# Patient Record
Sex: Female | Born: 1970
Health system: Southern US, Community
[De-identification: ages and names within clinical notes are randomized; demographics above are authoritative.]

## PROBLEM LIST (undated history)

## (undated) DIAGNOSIS — E119 Type 2 diabetes mellitus without complications: Secondary | ICD-10-CM

## (undated) DIAGNOSIS — IMO0002 Reserved for concepts with insufficient information to code with codable children: Secondary | ICD-10-CM

## (undated) DIAGNOSIS — R51 Headache: Secondary | ICD-10-CM

## (undated) DIAGNOSIS — N390 Urinary tract infection, site not specified: Secondary | ICD-10-CM

## (undated) DIAGNOSIS — R87619 Unspecified abnormal cytological findings in specimens from cervix uteri: Secondary | ICD-10-CM

## (undated) DIAGNOSIS — F419 Anxiety disorder, unspecified: Secondary | ICD-10-CM

## (undated) HISTORY — PX: NO PAST SURGERIES: SHX2092

---

## 2001-12-29 ENCOUNTER — Other Ambulatory Visit: Admission: RE | Admit: 2001-12-29 | Discharge: 2001-12-29 | Payer: Self-pay | Admitting: Obstetrics and Gynecology

## 2002-02-05 ENCOUNTER — Encounter: Payer: Self-pay | Admitting: *Deleted

## 2002-02-05 ENCOUNTER — Emergency Department (HOSPITAL_COMMUNITY): Admission: EM | Admit: 2002-02-05 | Discharge: 2002-02-06 | Payer: Self-pay | Admitting: Emergency Medicine

## 2003-02-27 ENCOUNTER — Other Ambulatory Visit: Admission: RE | Admit: 2003-02-27 | Discharge: 2003-02-27 | Payer: Self-pay | Admitting: Obstetrics and Gynecology

## 2004-02-28 ENCOUNTER — Other Ambulatory Visit: Admission: RE | Admit: 2004-02-28 | Discharge: 2004-02-28 | Payer: Self-pay | Admitting: Obstetrics and Gynecology

## 2005-08-26 ENCOUNTER — Other Ambulatory Visit: Admission: RE | Admit: 2005-08-26 | Discharge: 2005-08-26 | Payer: Self-pay | Admitting: Obstetrics and Gynecology

## 2005-09-15 ENCOUNTER — Encounter: Admission: RE | Admit: 2005-09-15 | Discharge: 2005-09-15 | Payer: Self-pay | Admitting: Obstetrics and Gynecology

## 2008-09-18 ENCOUNTER — Other Ambulatory Visit: Admission: RE | Admit: 2008-09-18 | Discharge: 2008-09-18 | Payer: Self-pay | Admitting: Obstetrics & Gynecology

## 2010-03-06 ENCOUNTER — Encounter: Admission: RE | Admit: 2010-03-06 | Discharge: 2010-03-06 | Payer: Self-pay | Admitting: Obstetrics and Gynecology

## 2010-05-02 LAB — HIV ANTIBODY (ROUTINE TESTING W REFLEX): HIV: NONREACTIVE

## 2010-05-02 LAB — ABO/RH

## 2010-05-02 LAB — HEPATITIS B SURFACE ANTIGEN: Hepatitis B Surface Ag: NEGATIVE

## 2010-05-02 LAB — CBC: Hemoglobin: 12.9 g/dL (ref 12.0–16.0)

## 2010-05-02 LAB — TYPE AND SCREEN: Antibody Screen: NEGATIVE

## 2010-05-02 LAB — RPR: RPR: NONREACTIVE

## 2010-05-26 NOTE — L&D Delivery Note (Signed)
Delivery Note At 10:57 AM a viable female was delivered via Vaginal, Spontaneous Delivery (Presentation: Left Occiput Anterior).  APGAR9/9:   Placenta status: , .  Cord: 3 vessels with the following complications: None.  Placenta intact with 3 vessel cord  Anesthesia: Epidural  Episiotomy: None Lacerations: 2nd degree Suture Repair: 2.0 chromic Est. Blood Loss (mL): 400  Mom to postpartum.  Baby to nursery-stable.  Lanitra Battaglini S 12/19/2010, 11:13 AM

## 2010-09-11 ENCOUNTER — Observation Stay (HOSPITAL_COMMUNITY)
Admission: AD | Admit: 2010-09-11 | Discharge: 2010-09-12 | Disposition: A | Discharge status: Home / Self Care | Payer: Managed Care, Other (non HMO) | Source: Ambulatory Visit | Attending: Obstetrics and Gynecology | Admitting: Obstetrics and Gynecology

## 2010-09-11 ENCOUNTER — Inpatient Hospital Stay (HOSPITAL_COMMUNITY): Payer: Managed Care, Other (non HMO)

## 2010-09-11 DIAGNOSIS — R109 Unspecified abdominal pain: Secondary | ICD-10-CM | POA: Insufficient documentation

## 2010-09-11 DIAGNOSIS — O99891 Other specified diseases and conditions complicating pregnancy: Principal | ICD-10-CM | POA: Insufficient documentation

## 2010-09-11 LAB — URINALYSIS, ROUTINE W REFLEX MICROSCOPIC
Bilirubin Urine: NEGATIVE
Ketones, ur: NEGATIVE mg/dL
Nitrite: NEGATIVE
Urobilinogen, UA: 0.2 mg/dL (ref 0.0–1.0)

## 2010-09-11 LAB — COMPREHENSIVE METABOLIC PANEL
ALT: 10 U/L (ref 0–35)
AST: 16 U/L (ref 0–37)
Albumin: 3 g/dL — ABNORMAL LOW (ref 3.5–5.2)
CO2: 20 mEq/L (ref 19–32)
Chloride: 108 mEq/L (ref 96–112)
GFR calc Af Amer: >60 mL/min (ref >60)
GFR calc non Af Amer: >60 mL/min (ref >60)
Potassium: 3.5 mEq/L (ref 3.5–5.1)
Sodium: 136 mEq/L (ref 135–145)
Total Bilirubin: 0.3 mg/dL (ref 0.3–1.2)

## 2010-09-11 LAB — URINE MICROSCOPIC-ADD ON

## 2010-09-11 LAB — CBC
Hemoglobin: 10.6 g/dL — ABNORMAL LOW (ref 12.0–15.0)
RBC: 3.51 MIL/uL — ABNORMAL LOW (ref 3.87–5.11)
WBC: 14.6 10*3/uL — ABNORMAL HIGH (ref 4.0–10.5)

## 2010-09-26 NOTE — Discharge Summary (Signed)
  Joanne Little, Joanne Little                  ACCOUNT NO.:  [id number]  MEDICAL RECORD NO.:  [id number]           PATIENT TYPE:  O  LOCATION:  9305                          FACILITY:  [facility]  PHYSICIAN:  Hatem Cull L. Savahna Casados, M.D.DATE OF BIRTH:  [birth year]  DATE OF ADMISSION:  09/11/2010 DATE OF DISCHARGE:  09/12/2010                              DISCHARGE SUMMARY   ADMITTING DIAGNOSES: 1. Intrauterine pregnancy at 93 weeks estimated gestational age. 2. Left flank pain.  DISCHARGE DIAGNOSES: 1. Intrauterine pregnancy at 91 weeks estimated gestational age. 2. Left flank pain resolved.  REASON FOR ADMISSION:  Please see written H&P.  HOSPITAL COURSE:  The patient is a 40 year old white married female gravida 2, para 0 that was admitted to [facility] at 69 weeks' estimated gestational age with sudden onset of left flank pain. The patient has also had some associated nausea and vomiting.  The patient was now admitted for renal ultrasound and pain management.  The patient was started on the morphine pump and did undergo ultrasound which revealed no hydronephrosis bilaterally.  Right ureteral jet was seen.  Left ureteral jet not demonstrated.  The patient was placed on n.p.o. status and placed on bedrest.  Urinalysis had revealed negative for nitrites, negative for leukocytosis, positive for hematuria.  Fetal fibronectin was also performed which was negative.  The following morning, pain was significantly improved.  Baby was active.  She denied any further nausea, vomiting, and felt hungry.  She denied any loss of fluid or vaginal bleeding.  Vital signs were stable with blood pressure 121/72.  Abdomen soft.  CBC had revealed hemoglobin of 10.6 and platelet count of 239,000.  Liver function tests were within normal limits.  The patient's diet was advanced and she continued to strain the urine on the following morning.  The patient denied any further pain.  Vital signs remained  stable.  Uterus was nontender.  Discharge instructions were reviewed and the patient was later discharged home.  CONDITION ON DISCHARGE:  Stable.  DIET:  Regular as tolerated.  ACTIVITY:  Up as tolerated.  She is to call for increasing pain, dysuria, decreasing fetal movement, loss of amniotic fluid, or vaginal bleeding.  DISCHARGE MEDICATIONS:  Prenatal vitamins 1 p.o. daily.     Julio Sicks, N.P.   ______________________________ Stann Mainland. Vincente Poli, M.D.    CC/MEDQ  D:  09/12/2010  T:  09/12/2010  Job:  [id number]  Electronically Signed by Julio Sicks N.P. on 09/16/2010 08:39:43 AM Electronically Signed by Marcelle Overlie M.D. on 09/26/2010 08:43:24 AM

## 2010-11-12 LAB — STREP B DNA PROBE
GBS: NEGATIVE
GBS: NEGATIVE

## 2010-12-16 ENCOUNTER — Encounter (HOSPITAL_COMMUNITY): Payer: Self-pay | Admitting: *Deleted

## 2010-12-16 ENCOUNTER — Inpatient Hospital Stay (HOSPITAL_COMMUNITY)
Admission: AD | Admit: 2010-12-16 | Discharge: 2010-12-16 | Disposition: A | Discharge status: Home / Self Care | Payer: Managed Care, Other (non HMO) | Source: Ambulatory Visit | Attending: Obstetrics and Gynecology | Admitting: Obstetrics and Gynecology

## 2010-12-16 DIAGNOSIS — O479 False labor, unspecified: Secondary | ICD-10-CM | POA: Insufficient documentation

## 2010-12-16 HISTORY — DX: Urinary tract infection, site not specified: N39.0

## 2010-12-16 HISTORY — DX: Anxiety disorder, unspecified: F41.9

## 2010-12-16 HISTORY — DX: Reserved for concepts with insufficient information to code with codable children: IMO0002

## 2010-12-16 HISTORY — DX: Unspecified abnormal cytological findings in specimens from cervix uteri: R87.619

## 2010-12-16 NOTE — Progress Notes (Signed)
Contractions now every 8 min, started at 1300. No bleeding or leaking.

## 2010-12-18 ENCOUNTER — Inpatient Hospital Stay (HOSPITAL_COMMUNITY)
Admission: AD | Admit: 2010-12-18 | Discharge: 2010-12-21 | DRG: 774 | Disposition: A | Discharge status: Home / Self Care | Payer: Managed Care, Other (non HMO) | Source: Ambulatory Visit | Attending: Obstetrics and Gynecology | Admitting: Obstetrics and Gynecology

## 2010-12-18 ENCOUNTER — Encounter (HOSPITAL_COMMUNITY): Payer: Self-pay | Admitting: *Deleted

## 2010-12-18 DIAGNOSIS — D649 Anemia, unspecified: Secondary | ICD-10-CM

## 2010-12-18 DIAGNOSIS — Z3490 Encounter for supervision of normal pregnancy, unspecified, unspecified trimester: Secondary | ICD-10-CM

## 2010-12-18 DIAGNOSIS — O480 Post-term pregnancy: Principal | ICD-10-CM | POA: Diagnosis present

## 2010-12-18 DIAGNOSIS — K649 Unspecified hemorrhoids: Secondary | ICD-10-CM | POA: Diagnosis present

## 2010-12-18 DIAGNOSIS — O878 Other venous complications in the puerperium: Secondary | ICD-10-CM | POA: Diagnosis present

## 2010-12-18 DIAGNOSIS — O09529 Supervision of elderly multigravida, unspecified trimester: Secondary | ICD-10-CM | POA: Diagnosis present

## 2010-12-18 HISTORY — DX: Headache: R51

## 2010-12-18 LAB — CBC
HCT: 33.1 % — ABNORMAL LOW (ref 36.0–46.0)
Hemoglobin: 11.1 g/dL — ABNORMAL LOW (ref 12.0–15.0)
MCH: 30.2 pg (ref 26.0–34.0)
MCHC: 33.5 g/dL (ref 30.0–36.0)

## 2010-12-18 MED ORDER — MISOPROSTOL 25 MCG QUARTER TABLET
25.0000 ug | ORAL_TABLET | ORAL | Status: AC
  Administered 2010-12-18 – 2010-12-19 (×2): 25 ug via VAGINAL
  Filled 2010-12-18 (×2): qty 0.25

## 2010-12-18 MED ORDER — OXYCODONE-ACETAMINOPHEN 5-325 MG PO TABS
2.0000 | ORAL_TABLET | ORAL | Status: DC | PRN
  Administered 2010-12-19: 2 via ORAL
  Filled 2010-12-18: qty 2

## 2010-12-18 MED ORDER — ACETAMINOPHEN 325 MG PO TABS: 650.0000 mg | ORAL_TABLET | ORAL | Status: DC | PRN

## 2010-12-18 MED ORDER — FLEET ENEMA 7-19 GM/118ML RE ENEM: 1.0000 | ENEMA | RECTAL | Status: DC | PRN

## 2010-12-18 MED ORDER — ONDANSETRON HCL 4 MG/2ML IJ SOLN: 4.0000 mg | Freq: Four times a day (QID) | INTRAMUSCULAR | Status: DC | PRN

## 2010-12-18 MED ORDER — CITRIC ACID-SODIUM CITRATE 334-500 MG/5ML PO SOLN: 30.0000 mL | ORAL | Status: DC | PRN

## 2010-12-18 MED ORDER — OXYTOCIN 20 UNITS IN LACTATED RINGERS INFUSION - SIMPLE
125.0000 mL/h | Freq: Once | INTRAVENOUS | Status: DC
  Administered 2010-12-19: 125 mL/h via INTRAVENOUS
  Filled 2010-12-18: qty 1000

## 2010-12-18 MED ORDER — LACTATED RINGERS IV SOLN
INTRAVENOUS | Status: DC
  Administered 2010-12-18 – 2010-12-19 (×2): via INTRAVENOUS
  Administered 2010-12-19: 125 mL/h via INTRAVENOUS

## 2010-12-18 MED ORDER — MISOPROSTOL 25 MCG QUARTER TABLET: 25.0000 ug | ORAL_TABLET | ORAL | Status: DC

## 2010-12-18 MED ORDER — IBUPROFEN 600 MG PO TABS
600.0000 mg | ORAL_TABLET | Freq: Four times a day (QID) | ORAL | Status: DC | PRN
  Administered 2010-12-19: 600 mg via ORAL
  Filled 2010-12-18: qty 1

## 2010-12-18 MED ORDER — LACTATED RINGERS IV SOLN: 500.0000 mL | INTRAVENOUS | Status: DC | PRN

## 2010-12-18 MED ORDER — ZOLPIDEM TARTRATE 10 MG PO TABS
10.0000 mg | ORAL_TABLET | Freq: Every evening | ORAL | Status: DC | PRN
  Administered 2010-12-18: 10 mg via ORAL
  Filled 2010-12-18: qty 1

## 2010-12-18 MED ORDER — LIDOCAINE HCL (PF) 1 % IJ SOLN
30.0000 mL | INTRAMUSCULAR | Status: DC | PRN
  Filled 2010-12-18 (×2): qty 30

## 2010-12-18 NOTE — Plan of Care (Signed)
Pt and husb are anxious to be moved to [facility] for AROM and Pitocin as planned when she was called to the [facility] by Dr. Arelia Sneddon today.  She has been in MAU  X 8.45 hours. Dr. Arelia Sneddon called due to continue wait for BS room availability.   The plan is to give pt a choice, she may eat regular diet if she wants to have cytotec tonight and Pitocin in the AM and to be clear liquids today if she wants to start Pitocin when BS room available. This discussed with patient and she elects  to continue to walk off and on while in MAU and agrees to the IV start for hydration

## 2010-12-18 NOTE — Progress Notes (Signed)
Pt comfortable in lounge chair, IV started, lights down, call light within reach, labs done

## 2010-12-18 NOTE — ED Notes (Signed)
Pt. tearful, states she is just emotional. States she has not slept in several nights. Pt. Reassurred.

## 2010-12-18 NOTE — Progress Notes (Signed)
More comfortable walking, wants to rest at this time, placed on left side, EFM applied

## 2010-12-18 NOTE — Progress Notes (Signed)
Up to walk.

## 2010-12-18 NOTE — Progress Notes (Signed)
Dr. Arelia Sneddon at bedside and notified of pt status, SVE, FHR, UC pattern, and pt's pain. Will continue to monitor.

## 2010-12-18 NOTE — Progress Notes (Signed)
  Patient finally got to labor and delivery at 6 pm.  Desired regular diet and cytotec instead of arom.  Cervix unchanged.  Fhr reactive no decel with irreg ctx's.

## 2010-12-18 NOTE — Progress Notes (Signed)
Pt states she is having irreg contractions. The office called and told to come today.

## 2010-12-18 NOTE — H&P (Signed)
Joanne Little is a 40 y.o. female presenting for spontaneous onset of labor and induction for postdates at 65 6/7 weeks.   Prenatal course complicated by ama with negative first trimester screen decline amnio.  Neg GBS.  History of tobacco use History OB History    Grav Para Term Preterm Abortions TAB SAB Ect Mult Living   2 0 0 0 1 0 1 0 0 0      Past Medical History  Diagnosis Date  . Kidney stones   . Urinary tract infection   . Anxiety   . Abnormal Pap smear   . Kidney stones    Past Surgical History  Procedure Date  . No past surgeries    Family History: family history is not on file. Social History:  reports that she has been smoking.  She has never used smokeless tobacco. She reports that she does not drink alcohol. Her drug history not on file.  ROS  Dilation: 3.5 Effacement (%): 80 Station: -2 Exam by:: Dr. Arelia Sneddon Blood pressure 123/65, pulse 92, temperature 98.4 F (36.9 C), temperature source Oral, resp. rate 20, height 5\' 9"  (1.753 m), weight 113.127 kg (249 lb 6.4 oz), last menstrual period 03/07/2010, SpO2 98.00%. Exam Physical Exam Gravid uterus c/w dates efw 7.5 lbs.  Cervix 3 +  80% effaced vtx at -1. Adequate pelvimetry.  fhr reactive no decels Prenatal labs: ABO, Rh:   Antibody: Negative (12/08 0000) Rubella:   RPR: Nonreactive (12/08 0000)  HBsAg: Negative (12/08 0000)  HIV: Non-reactive (12/08 0000)  GBS: Negative, Negative, Negative (06/19 0000)   Assessment/Plan:admitt for induction at post dates.  Procede with AROM and pitocin.   Christophe Rising S 12/18/2010, 8:33 AM

## 2010-12-18 NOTE — ED Notes (Signed)
EFM D/C'd for walking. Continues to take PO fluids

## 2010-12-19 ENCOUNTER — Inpatient Hospital Stay (HOSPITAL_COMMUNITY): Payer: Managed Care, Other (non HMO) | Admitting: Anesthesiology

## 2010-12-19 ENCOUNTER — Encounter (HOSPITAL_COMMUNITY): Payer: Self-pay | Admitting: *Deleted

## 2010-12-19 ENCOUNTER — Encounter (HOSPITAL_COMMUNITY): Payer: Self-pay | Admitting: Anesthesiology

## 2010-12-19 DIAGNOSIS — Z3490 Encounter for supervision of normal pregnancy, unspecified, unspecified trimester: Secondary | ICD-10-CM

## 2010-12-19 MED ORDER — LANOLIN HYDROUS EX OINT: TOPICAL_OINTMENT | CUTANEOUS | Status: DC | PRN

## 2010-12-19 MED ORDER — OXYTOCIN 20 UNITS IN LACTATED RINGERS INFUSION - SIMPLE
INTRAVENOUS | Status: AC
  Filled 2010-12-19: qty 1000

## 2010-12-19 MED ORDER — BISACODYL 10 MG RE SUPP: 10.0000 mg | Freq: Every day | RECTAL | Status: DC | PRN

## 2010-12-19 MED ORDER — SIMETHICONE 80 MG PO CHEW: 80.0000 mg | CHEWABLE_TABLET | ORAL | Status: DC | PRN

## 2010-12-19 MED ORDER — PHENYLEPHRINE 40 MCG/ML (10ML) SYRINGE FOR IV PUSH (FOR BLOOD PRESSURE SUPPORT)
80.0000 ug | PREFILLED_SYRINGE | INTRAVENOUS | Status: DC | PRN
  Filled 2010-12-19: qty 5

## 2010-12-19 MED ORDER — PRENATAL PLUS 27-1 MG PO TABS: 1.0000 | ORAL_TABLET | Freq: Every day | ORAL | Status: DC

## 2010-12-19 MED ORDER — HYDROCORTISONE ACE-PRAMOXINE 1-1 % RE FOAM
1.0000 | Freq: Two times a day (BID) | RECTAL | Status: DC
  Administered 2010-12-20 – 2010-12-21 (×2): 1 via RECTAL
  Filled 2010-12-19: qty 10

## 2010-12-19 MED ORDER — SODIUM CHLORIDE 0.9 % IJ SOLN
3.0000 mL | Freq: Two times a day (BID) | INTRAMUSCULAR | Status: DC
  Administered 2010-12-20 (×2): 3 mL via INTRAVENOUS

## 2010-12-19 MED ORDER — TETANUS-DIPHTH-ACELL PERTUSSIS 5-2.5-18.5 LF-MCG/0.5 IM SUSP: 0.5000 mL | Freq: Once | INTRAMUSCULAR | Status: DC

## 2010-12-19 MED ORDER — MEASLES, MUMPS & RUBELLA VAC ~~LOC~~ INJ: 0.5000 mL | INJECTION | Freq: Once | SUBCUTANEOUS | Status: DC

## 2010-12-19 MED ORDER — BENZOCAINE-MENTHOL 20-0.5 % EX AERO
INHALATION_SPRAY | CUTANEOUS | Status: AC
  Filled 2010-12-19: qty 56

## 2010-12-19 MED ORDER — FLEET ENEMA 7-19 GM/118ML RE ENEM: 1.0000 | ENEMA | RECTAL | Status: DC | PRN

## 2010-12-19 MED ORDER — ONDANSETRON HCL 4 MG PO TABS: 4.0000 mg | ORAL_TABLET | ORAL | Status: DC | PRN

## 2010-12-19 MED ORDER — DIPHENHYDRAMINE HCL 25 MG PO CAPS: 25.0000 mg | ORAL_CAPSULE | Freq: Four times a day (QID) | ORAL | Status: DC | PRN

## 2010-12-19 MED ORDER — WITCH HAZEL-GLYCERIN EX PADS: MEDICATED_PAD | CUTANEOUS | Status: DC | PRN

## 2010-12-19 MED ORDER — DIPHENHYDRAMINE HCL 50 MG/ML IJ SOLN: 12.5000 mg | INTRAMUSCULAR | Status: DC | PRN

## 2010-12-19 MED ORDER — NALBUPHINE SYRINGE 5 MG/0.5 ML
10.0000 mg | INJECTION | Freq: Four times a day (QID) | INTRAMUSCULAR | Status: DC | PRN
  Administered 2010-12-19: 10 mg via INTRAVENOUS
  Filled 2010-12-19 (×2): qty 1

## 2010-12-19 MED ORDER — EPHEDRINE 5 MG/ML INJ
10.0000 mg | INTRAVENOUS | Status: DC | PRN
  Filled 2010-12-19: qty 4

## 2010-12-19 MED ORDER — METHYLERGONOVINE MALEATE 0.2 MG PO TABS
0.2000 mg | ORAL_TABLET | Freq: Three times a day (TID) | ORAL | Status: DC
  Administered 2010-12-19 – 2010-12-21 (×5): 0.2 mg via ORAL
  Filled 2010-12-19 (×6): qty 1

## 2010-12-19 MED ORDER — SENNOSIDES-DOCUSATE SODIUM 8.6-50 MG PO TABS
1.0000 | ORAL_TABLET | Freq: Every day | ORAL | Status: DC
  Administered 2010-12-20: 1 via ORAL

## 2010-12-19 MED ORDER — BENZOCAINE-MENTHOL 20-0.5 % EX AERO: 1.0000 "application " | INHALATION_SPRAY | Freq: Four times a day (QID) | CUTANEOUS | Status: DC | PRN

## 2010-12-19 MED ORDER — SODIUM CHLORIDE 0.9 % IJ SOLN: 3.0000 mL | INTRAMUSCULAR | Status: DC | PRN

## 2010-12-19 MED ORDER — SODIUM CHLORIDE 0.9 % IJ SOLN: 3.0000 mL | Freq: Two times a day (BID) | INTRAMUSCULAR | Status: DC

## 2010-12-19 MED ORDER — ONDANSETRON HCL 4 MG/2ML IJ SOLN: 4.0000 mg | INTRAMUSCULAR | Status: DC | PRN

## 2010-12-19 MED ORDER — SENNOSIDES-DOCUSATE SODIUM 8.6-50 MG PO TABS: 1.0000 | ORAL_TABLET | Freq: Every day | ORAL | Status: DC

## 2010-12-19 MED ORDER — SODIUM CHLORIDE 0.9 % IV SOLN: 250.0000 mL | INTRAVENOUS | Status: DC

## 2010-12-19 MED ORDER — FENTANYL 2.5 MCG/ML BUPIVACAINE 1/10 % EPIDURAL INFUSION (WH - ANES)
14.0000 mL/h | INTRAMUSCULAR | Status: DC
  Administered 2010-12-19 (×2): 14 mL/h via EPIDURAL
  Filled 2010-12-19 (×2): qty 60

## 2010-12-19 MED ORDER — IBUPROFEN 600 MG PO TABS
600.0000 mg | ORAL_TABLET | Freq: Four times a day (QID) | ORAL | Status: DC
  Administered 2010-12-19 – 2010-12-21 (×8): 600 mg via ORAL
  Filled 2010-12-19 (×8): qty 1

## 2010-12-19 MED ORDER — OXYTOCIN 20 UNITS IN LACTATED RINGERS INFUSION - SIMPLE
125.0000 mL/h | INTRAVENOUS | Status: DC | PRN
  Administered 2010-12-19: 125 mL/h via INTRAVENOUS

## 2010-12-19 MED ORDER — OXYCODONE-ACETAMINOPHEN 5-325 MG PO TABS: 1.0000 | ORAL_TABLET | ORAL | Status: DC | PRN

## 2010-12-19 MED ORDER — DIBUCAINE 1 % RE OINT
TOPICAL_OINTMENT | RECTAL | Status: DC | PRN
  Administered 2010-12-20: 11:00:00 via RECTAL
  Filled 2010-12-19: qty 28
  Filled 2010-12-19: qty 56

## 2010-12-19 MED ORDER — PHENYLEPHRINE 40 MCG/ML (10ML) SYRINGE FOR IV PUSH (FOR BLOOD PRESSURE SUPPORT)
80.0000 ug | PREFILLED_SYRINGE | INTRAVENOUS | Status: DC | PRN
  Filled 2010-12-19 (×2): qty 5

## 2010-12-19 MED ORDER — NALBUPHINE HCL 10 MG/ML IJ SOLN
10.0000 mg | Freq: Four times a day (QID) | INTRAMUSCULAR | Status: DC | PRN
  Filled 2010-12-19: qty 1

## 2010-12-19 MED ORDER — BENZOCAINE-MENTHOL 20-0.5 % EX AERO: 1.0000 "application " | INHALATION_SPRAY | CUTANEOUS | Status: DC | PRN

## 2010-12-19 MED ORDER — OXYCODONE-ACETAMINOPHEN 5-325 MG PO TABS
1.0000 | ORAL_TABLET | ORAL | Status: DC | PRN
  Administered 2010-12-19: 2 via ORAL
  Administered 2010-12-20: 1 via ORAL
  Administered 2010-12-20 (×2): 2 via ORAL
  Administered 2010-12-21: 1 via ORAL
  Filled 2010-12-19: qty 2
  Filled 2010-12-19: qty 1
  Filled 2010-12-19 (×6): qty 2

## 2010-12-19 MED ORDER — MEDROXYPROGESTERONE ACETATE 150 MG/ML IM SUSP: 150.0000 mg | INTRAMUSCULAR | Status: DC | PRN

## 2010-12-19 MED ORDER — EPHEDRINE 5 MG/ML INJ
10.0000 mg | INTRAVENOUS | Status: DC | PRN
  Filled 2010-12-19 (×2): qty 4

## 2010-12-19 MED ORDER — METHYLERGONOVINE MALEATE 0.2 MG/ML IJ SOLN
0.2000 mg | Freq: Once | INTRAMUSCULAR | Status: AC
  Administered 2010-12-19: 0.2 mg via INTRAMUSCULAR
  Filled 2010-12-19: qty 1

## 2010-12-19 MED ORDER — ZOLPIDEM TARTRATE 5 MG PO TABS: 5.0000 mg | ORAL_TABLET | Freq: Every evening | ORAL | Status: DC | PRN

## 2010-12-19 MED ORDER — NALBUPHINE HCL 10 MG/ML IJ SOLN: 10.0000 mg | Freq: Four times a day (QID) | INTRAMUSCULAR | Status: DC | PRN

## 2010-12-19 MED ORDER — LACTATED RINGERS IV SOLN
500.0000 mL | Freq: Once | INTRAVENOUS | Status: AC
  Administered 2010-12-19: 500 mL via INTRAVENOUS

## 2010-12-19 MED ORDER — OXYTOCIN 20 UNITS IN LACTATED RINGERS INFUSION - SIMPLE: 125.0000 mL/h | INTRAVENOUS | Status: DC | PRN

## 2010-12-19 MED ORDER — NALBUPHINE SYRINGE 5 MG/0.5 ML
10.0000 mg | INJECTION | Freq: Once | INTRAMUSCULAR | Status: AC
  Administered 2010-12-19: 10 mg via INTRAVENOUS

## 2010-12-19 MED ORDER — IBUPROFEN 600 MG PO TABS: 600.0000 mg | ORAL_TABLET | Freq: Four times a day (QID) | ORAL | Status: DC

## 2010-12-19 MED ORDER — PRENATAL PLUS 27-1 MG PO TABS
1.0000 | ORAL_TABLET | Freq: Every day | ORAL | Status: DC
  Administered 2010-12-20 – 2010-12-21 (×2): 1 via ORAL
  Filled 2010-12-19 (×2): qty 1

## 2010-12-19 NOTE — Progress Notes (Signed)
Pt up to Br at 1445 and started bleeding and passing clots, pt back to bed and continued to bleed and pass clots IV of LR with 20units pitocin started at 125/hr. Dr. Vincente Poli called and came immediately to the room and removed clots.  Methergine IM given and Nubain 10mg  given

## 2010-12-19 NOTE — Anesthesia Postprocedure Evaluation (Signed)
Vital signs stable Patient alert Pain and nausea are controlled No apparent anesthetic complications No follow up care needed Pt may be d/c when legs are bending 

## 2010-12-19 NOTE — Anesthesia Preprocedure Evaluation (Addendum)
Anesthesia Evaluation  Name, MR# and DOB Patient awake  General Assessment Comment  Reviewed: Allergy & Precautions, H&P  and Patient's Chart, lab work & pertinent test results  Airway Mallampati: III TM Distance: >3 FB Neck ROM: full    Dental  (+) Teeth Intact   Pulmonary  clear to auscultation    Cardiovascular regular Normal   Neuro/Psych  GI/Hepatic/Renal   Endo/Other   (+)  Morbid obesity Abdominal   Musculoskeletal  Hematology   Peds  Reproductive/Obstetrics (+) Pregnancy   Anesthesia Other Findings                 Anesthesia Physical Anesthesia Plan  ASA: III  Anesthesia Plan: Epidural   Post-op Pain Management:    Induction:   Airway Management Planned:   Additional Equipment:   Intra-op Plan:   Post-operative Plan:   Informed Consent: I have reviewed the patients History and Physical, chart, labs and discussed the procedure including the risks, benefits and alternatives for the proposed anesthesia with the patient or authorized representative who has indicated his/her understanding and acceptance.   Dental Advisory Given  Plan Discussed with: CRNA and Surgeon  Anesthesia Plan Comments: (Labs checked- platelets confirmed with RN in room. Fetal heart tracing, per RN, reportedly stable enough for sitting procedure. Discussed epidural, and patient consents to the procedure:  included risk of possible headache,backache, failed block, allergic reaction, and nerve injury. This patient was asked if she had any questions or concerns before the procedure started. )        Anesthesia Quick Evaluation

## 2010-12-19 NOTE — Progress Notes (Signed)
KYLEENA SCHEIRER is a 40 y.o. G2P0010 at [redacted]w[redacted]d by LMP admitted for induction of labor due to Post dates.   Subjective  Status post epidural, comfortable   Objective: BP 120/70  Pulse 87  Temp(Src) 97.8 F (36.6 C) (Oral)  Resp 18  Ht 5\' 9"  (1.753 m)  Wt 113.127 kg (249 lb 6.4 oz)  BMI 36.83 kg/m2  SpO2 98%  LMP 03/07/2010      FHT:  FHR: 150 bpm, variability: moderate,  accelerations:  Present,  decelerations:  Absent UC:   regular, every 2 minutes SVE:   Dilation: 9 Effacement (%): 100 Station: -1 Exam by:: Dr. Vincente Poli  Labs: Lab Results  Component Value Date   WBC 12.6* 12/18/2010   HGB 11.1* 12/18/2010   HCT 33.1* 12/18/2010   MCV 90.2 12/18/2010   PLT 241 12/18/2010    Assessment / Plan: Induction of labor due to postterm,  progressing well on pitocin  Labor: Progressing normally Preeclampsia:  no signs or symptoms of toxicity Fetal Wellbeing:  Category I Pain Control:  Epidural I/D:  n/a Anticipated MOD:  NSVD  Shawnette Augello L 12/19/2010, 7:52 AM

## 2010-12-19 NOTE — Progress Notes (Signed)
Dr. Vincente Poli notified of pt status, SVE, FHR, UC pattern, and pt pushing with each UC.  Will continue to monitor.

## 2010-12-19 NOTE — Progress Notes (Signed)
Dr. Vincente Poli at bedside and notified of pt status, SVE, FHR, UC pattern, and epidural placement.  AROM, will continue to monitor.

## 2010-12-19 NOTE — Plan of Care (Signed)
Problem: Discharge Progression Outcomes Goal: Barriers To Progression Addressed/Resolved Outcome: Progressing ppHemm- required manual removal x2, Methergine , & extra Pitocin

## 2010-12-19 NOTE — Anesthesia Procedure Notes (Addendum)
Epidural Patient location during procedure: OB Start time: 12/19/2010 6:24 AM  Staffing Anesthesiologist: Jiles Garter  Preanesthetic Checklist Completed: patient identified, site marked, surgical consent, pre-op evaluation, timeout performed, IV checked, risks and benefits discussed and monitors and equipment checked  Epidural Patient position: sitting Prep: site prepped and draped and DuraPrep Patient monitoring: continuous pulse ox and blood pressure Approach: midline Injection technique: LOR air  Needle:  Needle type: Tuohy  Needle gauge: 17 G Needle length: 9 cm Needle insertion depth: 7 cm Catheter type: closed end flexible Catheter size: 19 Gauge Catheter at skin depth: 14 cm Test dose: negative  Assessment Events: blood not aspirated, injection not painful, no injection resistance, negative IV test and no paresthesia  Additional Notes Dosing of Epidural: 1st dose, Through needle...... 5mg  Marcaine 2nd dose, through catheter.... epi 1:200K + Xylocaine 40 mg 3rd dose, through catheter...Marland KitchenMarland Kitchenepi 1:200K + Xylocaine 60 mg Each dose occurred after waiting 3 min,patient was free of IV sx; and patient exhibits no evidence of SA injection  Patient is more comfortable after epidural dosed. Please see RN's note for documentation of vital signs,and FHR which are stable.

## 2010-12-19 NOTE — Progress Notes (Signed)
  CTSP for passage of large amount of clots in bathroom I immediately arrived and patient already on the bed with 2 nurses there. She was very uncomfortable Bleeding was diminished but uterine fundus 2 fb above umbilicus Vaginal exam - removed moderate amount of clots from vagina. Uterus then 1 fb below umbilicus Will give nubain x 1 And methergine x 1  Then put on po methergine series. Questions answered at bedside.

## 2010-12-19 NOTE — Progress Notes (Signed)
Dr. Vincente Poli at bedside and notified of pt status, FHR, UC pattern, and pt pushing with each UC. Will continue to monitor.

## 2010-12-19 NOTE — Progress Notes (Signed)
Dr. Arelia Sneddon at bedside for delivery and notified of pt status, SVE, FHR, UC pattern, and pt pushing with each UC. Will continue to monitor.

## 2010-12-19 NOTE — Progress Notes (Signed)
ctsp by nurse patient complaining of significant rectal pressure. Bleeding earlier has become very light since i cleared out clots and administered methergine. Exam with nurse 2 large swollen nonthrombosed hemorrhoids Internal exam  No vaginal hematoma but i did evacuate about 200 cc old clots from the vagina Patient tolerated it poorly - if she needs another exam i recommend with iv sedation Uterus firm 2 fb below umbilicus Will give iv nubain now Continue methergine series Ice pack to perineum Patient reports history of "always heavy periods" and with this excessive bleeding postpartum I discussed with her possible hematology consult postpartum to rule out possible Von [place]ands' All questions answered at bedside. Spent approx 25 min with the patient.

## 2010-12-20 LAB — CBC
HCT: 21.9 % — ABNORMAL LOW (ref 36.0–46.0)
Hemoglobin: 7.3 g/dL — ABNORMAL LOW (ref 12.0–15.0)
MCH: 30.3 pg (ref 26.0–34.0)
MCHC: 33.3 g/dL (ref 30.0–36.0)
MCV: 90.9 fL (ref 78.0–100.0)
RDW: 14.7 % (ref 11.5–15.5)

## 2010-12-20 MED ORDER — FERROUS SULFATE 325 (65 FE) MG PO TABS
325.0000 mg | ORAL_TABLET | Freq: Three times a day (TID) | ORAL | Status: DC
  Administered 2010-12-20 – 2010-12-21 (×3): 325 mg via ORAL
  Filled 2010-12-20 (×3): qty 1

## 2010-12-20 NOTE — Progress Notes (Signed)
[facility] INFORMATION GIVEN TO PATIENT.  THIS IS FIRST BABY AND PATIENT STATES BABY HAS BEEN FEEDING WELL SINCE BIRTH.  SEVERAL VOIDS AND STOOLS NOTED.  ENCOURAGED TO CALL LC WITH CONCERNS OR ASSIST.

## 2010-12-20 NOTE — Progress Notes (Signed)
Post Partum Day 1 Subjective: c/o tired  Objective: Blood pressure 87/59, pulse 92, temperature 97.9 F (36.6 C), temperature source Oral, resp. rate 18, height 5\' 9"  (1.753 m), weight 113.127 kg (249 lb 6.4 oz), last menstrual period 03/07/2010, SpO2 98.00%, unknown if currently breastfeeding.  Physical Exam:  General: alert and cooperative Lochia: appropriate Uterine Fundus: firm Perineum intact, small hemorrhoid,soft DVT Evaluation: No evidence of DVT seen on physical exam.   Basename 12/20/10 0535 12/18/10 1541  HGB 7.3* 11.1*  HCT 21.9* 33.1*    Assessment/Plan: Plan for discharge tomorrow FeSo4  CBC in am   LOS: 2 days   Joanne Little G 12/20/2010, 8:26 AM

## 2010-12-21 LAB — CBC
MCH: 30.5 pg (ref 26.0–34.0)
MCHC: 33.2 g/dL (ref 30.0–36.0)
MCV: 91.8 fL (ref 78.0–100.0)
Platelets: 186 10*3/uL (ref 150–400)
RBC: 2.2 MIL/uL — ABNORMAL LOW (ref 3.87–5.11)
RDW: 15 % (ref 11.5–15.5)

## 2010-12-21 MED ORDER — METHYLERGONOVINE MALEATE 0.2 MG PO TABS
0.2000 mg | ORAL_TABLET | Freq: Three times a day (TID) | ORAL | Status: AC
Start: 2010-12-21 — End: 2011-12-21

## 2010-12-21 MED ORDER — FERROUS SULFATE 325 (65 FE) MG PO TABS: 325.0000 mg | ORAL_TABLET | Freq: Three times a day (TID) | ORAL | Status: DC

## 2010-12-21 MED ORDER — BENZOCAINE-MENTHOL 20-0.5 % EX AERO
INHALATION_SPRAY | CUTANEOUS | Status: AC
  Filled 2010-12-21: qty 56

## 2010-12-21 MED ORDER — IBUPROFEN 600 MG PO TABS: 600.0000 mg | ORAL_TABLET | Freq: Four times a day (QID) | ORAL | Status: AC

## 2010-12-21 MED ORDER — OXYCODONE-ACETAMINOPHEN 5-325 MG PO TABS: 1.0000 | ORAL_TABLET | ORAL | Status: AC | PRN

## 2010-12-21 NOTE — Progress Notes (Signed)
Post Partum Day 2 Subjective: no complaints, no further heavy vb  Objective: Blood pressure 107/71, pulse 96, temperature 98.3 F (36.8 C), temperature source Oral, resp. rate 18, height 5\' 9"  (1.753 m), weight 113.127 kg (249 lb 6.4 oz), last menstrual period 03/07/2010, SpO2 98.00%, unknown if currently breastfeeding.  Physical Exam:  General: alert and cooperative Lochia: appropriate Uterine Fundus: firm DVT Evaluation: No evidence of DVT seen on physical exam.   Basename 12/21/10 0500 12/20/10 0535  HGB 6.7* 7.3*  HCT 20.2* 21.9*    Assessment/Plan: Discharge home Rx percocet, methergine, iron.  Resume pnv   LOS: 3 days   Joanne Little 12/21/2010, 8:33 AM

## 2010-12-21 NOTE — Progress Notes (Signed)
Poor position observed, pinching in football hold. Positional strip noted on left nipple assistance in proper position and better support.. Mother receptive to teaching. inst to hand pump for a few mins to reduce swelling of tissue. Scheduled out patient visit for August 6 at 1:00.

## 2010-12-21 NOTE — Discharge Summary (Signed)
Obstetric Discharge Summary Reason for Admission: induction of labor Prenatal Procedures: none Intrapartum Procedures: spontaneous vaginal delivery Postpartum Procedures: postpartum hemmorage with evacuation of clots Complications-Operative and Postpartum: hemorrhage  Hemoglobin  Date Value Range Status  12/21/2010 6.7* 12.0-15.0 (g/dL) Final     CRITICAL RESULT CALLED TO, READ BACK BY AND VERIFIED WITH:     BURNS,S. AT 1610 ON 12/21/2010 BY HOUEGNIFIO M.     DELTA CHECK NOTED     REPEATED TO VERIFY     HCT  Date Value Range Status  12/21/2010 20.2* 36.0-46.0 (%) Final    Discharge Diagnoses: Term Pregnancy-delivered and postpartum hemorrhage  Discharge Information: Date: 12/21/2010 Activity: unrestricted Diet: routine Medications: PNV, Ibuprophen, Iron, Percocet and methergine Condition: stable Instructions: refer to practice specific booklet Discharge to: home Follow up office:  Tuesday for Hgb and BP check   Newborn Data: Live born  Information for the patient's newborn:  Shayleen, Eppinger [[id number]]  female ; APGAR , ; weight ;  Home with mother.  Ellicia Alix 12/21/2010, 8:38 AM

## 2010-12-30 ENCOUNTER — Ambulatory Visit (HOSPITAL_COMMUNITY)
Admit: 2010-12-30 | Discharge: 2010-12-30 | Disposition: A | Discharge status: Home / Self Care | Payer: Managed Care, Other (non HMO) | Attending: Obstetrics and Gynecology | Admitting: Obstetrics and Gynecology

## 2010-12-30 NOTE — Progress Notes (Signed)
Mom is here for outpatient appointment for feeding assessment. [name] was born [birth year], now 71 days old, birth weight was 6lb 11 oz.. Weight at discharge from the hospital was 6lb 4oz. Mom reports baby nursing every 2-3 hours for 15 minutes usually both breasts. She has been pumping to relieve some slight engorgement and baby has been supplemented once each night with a bottle. Mom reports baby voiding/stooling 5-6 times/day.  With visit today, baby latched well to right breast, nursed for 15 minutes and transferred per weight 50ml. Pre-feed weight was 6lb 12.7 oz/3082 gm. Post-feed weight after nursing on right breast was 6lb. 14.5 oz/3132 gm. Baby nursed on left breast for 11 minutes and per weight transferred 32 ml. Pre-feed weight was 6lb 14.5oz and post-feed with was 6lb. 15.6oz/3164gm. Good rhythmic suck and swallows heard with breastfeeding. Breasts softening after nursing. No redness/warmth or signs of infection observed.  Total received with feeding was 82 ml.  Discussed with mom engorgement care, postpumping to comfort to relieve nodules, if present, using ice packs/heat for comfort. Signs and symptoms of infection reviewed. Pumping and storage reviewed. Parents reassured regarding breastfeeding.

## 2011-01-03 ENCOUNTER — Encounter (HOSPITAL_COMMUNITY): Payer: Self-pay | Admitting: *Deleted

## 2013-06-08 ENCOUNTER — Encounter: Payer: Self-pay | Admitting: Family Medicine

## 2013-06-08 ENCOUNTER — Ambulatory Visit (INDEPENDENT_AMBULATORY_CARE_PROVIDER_SITE_OTHER): Payer: 59 | Admitting: Family Medicine

## 2013-06-08 VITALS — BP 128/84 | Ht 70.5 in | Wt 256.0 lb

## 2013-06-08 DIAGNOSIS — J069 Acute upper respiratory infection, unspecified: Secondary | ICD-10-CM

## 2013-06-08 DIAGNOSIS — H6690 Otitis media, unspecified, unspecified ear: Secondary | ICD-10-CM

## 2013-06-08 DIAGNOSIS — H6691 Otitis media, unspecified, right ear: Secondary | ICD-10-CM

## 2013-06-08 MED ORDER — HYDROCODONE-ACETAMINOPHEN 7.5-325 MG PO TABS: 1.0000 | ORAL_TABLET | Freq: Four times a day (QID) | ORAL | Status: DC | PRN

## 2013-06-08 MED ORDER — FLUTICASONE PROPIONATE 50 MCG/ACT NA SUSP: 2.0000 | Freq: Every day | NASAL | Status: DC

## 2013-06-08 MED ORDER — CEFPROZIL 500 MG PO TABS: 500.0000 mg | ORAL_TABLET | Freq: Two times a day (BID) | ORAL | Status: DC

## 2013-06-08 NOTE — Patient Instructions (Signed)
Otitis Media, Adult Otitis media is redness, soreness, and swelling (inflammation) of the middle ear. Otitis media may be caused by allergies or, most commonly, by infection. Often it occurs as a complication of the common cold. SIGNS AND SYMPTOMS Symptoms of otitis media may include:  Earache.  Fever.  Ringing in your ear.  Headache.  Leakage of fluid from the ear. DIAGNOSIS To diagnose otitis media, your health care provider will examine your ear with an otoscope. This is an instrument that allows your health care provider to see into your ear in order to examine your eardrum. Your health care provider also will ask you questions about your symptoms. TREATMENT  Typically, otitis media resolves on its own within 3 5 days. Your health care provider may prescribe medicine to ease your symptoms of pain. If otitis media does not resolve within 5 days or is recurrent, your health care provider may prescribe antibiotic medicines if he or she suspects that a bacterial infection is the cause. HOME CARE INSTRUCTIONS   Take your medicine as directed until it is gone, even if you feel better after the first few days.  Only take over-the-counter or prescription medicines for pain, discomfort, or fever as directed by your health care provider.  Follow up with your health care provider as directed. SEEK MEDICAL CARE IF:  You have otitis media only in one ear or bleeding from your nose or both.  You notice a lump on your neck.  You are not getting better in 3 5 days.  You feel worse instead of better. SEEK IMMEDIATE MEDICAL CARE IF:   You have pain that is not controlled with medicine.  You have swelling, redness, or pain around your ear or stiffness in your neck.  You notice that part of your face is paralyzed.  You notice that the bone behind your ear (mastoid) is tender when you touch it. MAKE SURE YOU:   Understand these instructions.  Will watch your condition.  Will get help  right away if you are not doing well or get worse. Document Released: 02/15/2004 Document Revised: 03/02/2013 Document Reviewed: 12/07/2012 Exit[employer] Patient Information 2014 [employer].  

## 2013-06-08 NOTE — Progress Notes (Signed)
   Subjective:    Patient ID: Joanne Little, female    DOB: 12/20/1970, 43 y.o.   MRN: [id number]  Otalgia  There is pain in both ears. The current episode started in the past 7 days. Associated symptoms include headaches and a sore throat. Treatments tried: amoxil 875 BID, otc decogestant, vit c. The treatment provided no relief.   Patient relates some sinus symptoms recently but now having severe ear pain kept her up last night   Review of Systems  HENT: Positive for ear pain and sore throat.   Neurological: Positive for headaches.       Objective:   Physical Exam Right otitis media is noted left TM is normal nostrils normal throat normal neck supple mild sinus tenderness right side lungs clear heart regular patient not toxic       Assessment & Plan:  Upper respiratory illness along with a right otitis media antibiotics prescribed followup if progressive troubles warning signs discussed no sign of the influenza.  Pain medicine was prescribed for the patient cautioned drowsiness

## 2013-06-20 ENCOUNTER — Telehealth: Payer: Self-pay | Admitting: Family Medicine

## 2013-06-20 MED ORDER — AMOXICILLIN-POT CLAVULANATE 875-125 MG PO TABS: 1.0000 | ORAL_TABLET | Freq: Two times a day (BID) | ORAL | Status: DC

## 2013-06-20 NOTE — Telephone Encounter (Signed)
Augmentin 875 mg one tablet twice a day with food for the next 10 days if ongoing trouble followup

## 2013-06-20 NOTE — Telephone Encounter (Signed)
Patient was seen 1/14 with earpain and given antibotic still having earpain can you call in something else or does she need to make appointment. [facility] [place]

## 2013-06-20 NOTE — Telephone Encounter (Signed)
Medication sent to pharmacy. Patient was notified.  

## 2013-06-20 NOTE — Addendum Note (Signed)
Addended by: Dereck LigasJOHNSON, Shamon Lobo P on: 06/20/2013 02:13 PM   Modules accepted: Orders

## 2013-06-28 ENCOUNTER — Other Ambulatory Visit: Payer: Self-pay

## 2013-06-28 DIAGNOSIS — Z1231 Encounter for screening mammogram for malignant neoplasm of breast: Secondary | ICD-10-CM

## 2013-07-08 ENCOUNTER — Ambulatory Visit
Admission: RE | Admit: 2013-07-08 | Discharge: 2013-07-08 | Disposition: A | Discharge status: Home / Self Care | Payer: 59 | Source: Ambulatory Visit

## 2013-07-08 ENCOUNTER — Ambulatory Visit
Admission: RE | Admit: 2013-07-08 | Discharge: 2013-07-08 | Disposition: A | Discharge status: Home / Self Care | Payer: 59 | Source: Ambulatory Visit | Attending: Obstetrics and Gynecology | Admitting: Obstetrics and Gynecology

## 2013-07-08 ENCOUNTER — Other Ambulatory Visit: Payer: Self-pay | Admitting: Obstetrics and Gynecology

## 2013-07-08 DIAGNOSIS — N6452 Nipple discharge: Secondary | ICD-10-CM

## 2013-07-08 DIAGNOSIS — Z1231 Encounter for screening mammogram for malignant neoplasm of breast: Secondary | ICD-10-CM

## 2013-10-31 ENCOUNTER — Encounter: Payer: Self-pay | Admitting: Family Medicine

## 2013-10-31 ENCOUNTER — Ambulatory Visit (INDEPENDENT_AMBULATORY_CARE_PROVIDER_SITE_OTHER): Payer: 59 | Admitting: Family Medicine

## 2013-10-31 VITALS — BP 112/90 | Temp 98.3°F | Ht 70.5 in | Wt 251.0 lb

## 2013-10-31 DIAGNOSIS — J209 Acute bronchitis, unspecified: Secondary | ICD-10-CM

## 2013-10-31 DIAGNOSIS — J45909 Unspecified asthma, uncomplicated: Secondary | ICD-10-CM

## 2013-10-31 DIAGNOSIS — Z0189 Encounter for other specified special examinations: Secondary | ICD-10-CM

## 2013-10-31 DIAGNOSIS — J069 Acute upper respiratory infection, unspecified: Secondary | ICD-10-CM

## 2013-10-31 MED ORDER — CEFPROZIL 500 MG PO TABS: 500.0000 mg | ORAL_TABLET | Freq: Two times a day (BID) | ORAL | Status: DC

## 2013-10-31 MED ORDER — PREDNISONE 20 MG PO TABS: ORAL_TABLET | ORAL | Status: AC

## 2013-10-31 MED ORDER — ALBUTEROL SULFATE HFA 108 (90 BASE) MCG/ACT IN AERS: 2.0000 | INHALATION_SPRAY | Freq: Four times a day (QID) | RESPIRATORY_TRACT | Status: DC | PRN

## 2013-10-31 NOTE — Progress Notes (Signed)
   Subjective:    Patient ID: Joanne Little, female    DOB: [birth year], 43 y.o.   MRN: [id number]  Cough This is a new problem. The current episode started 1 to 4 weeks ago. The problem has been gradually worsening. The problem occurs constantly. The cough is productive of sputum. Associated symptoms include chills, a fever and rhinorrhea. Pertinent negatives include no chest pain, ear pain, shortness of breath or wheezing. Nothing aggravates the symptoms. Treatments tried: Mucinex. The treatment provided no relief.   Patient states she has no other concerns at this time.  Started 10 days ago  Denies any vomiting but does relate some fever and chills. Daughter recently sick with asthma flareup and infection Review of Systems  Constitutional: Positive for fever and chills. Negative for activity change.  HENT: Positive for congestion and rhinorrhea. Negative for ear pain.   Eyes: Negative for discharge.  Respiratory: Positive for cough. Negative for shortness of breath and wheezing.   Cardiovascular: Negative for chest pain.  Gastrointestinal: Negative for nausea, vomiting and abdominal pain.       Objective:   Physical Exam  Nursing note and vitals reviewed. Constitutional: She appears well-developed.  HENT:  Head: Normocephalic.  Nose: Nose normal.  Mouth/Throat: Oropharynx is clear and moist. No oropharyngeal exudate.  Neck: Neck supple.  Cardiovascular: Normal rate and normal heart sounds.   No murmur heard. Pulmonary/Chest: Effort normal. She has wheezes.  Lymphadenopathy:    She has no cervical adenopathy.  Skin: Skin is warm and dry.          Assessment & Plan:  Upper respiratory illness along with reactive airway disease and bronchitis-prednisone taper, antibiotics, albuterol. Patient strongly urged to quit smoking she is going to try nicotine patches. Although we could do pulmonary function testing I don't feel necessary at this point.

## 2014-03-27 ENCOUNTER — Encounter: Payer: Self-pay | Admitting: Family Medicine

## 2016-03-04 DIAGNOSIS — Z6837 Body mass index (BMI) 37.0-37.9, adult: Secondary | ICD-10-CM | POA: Diagnosis not present

## 2016-03-04 DIAGNOSIS — Z01419 Encounter for gynecological examination (general) (routine) without abnormal findings: Secondary | ICD-10-CM | POA: Diagnosis not present

## 2016-05-01 ENCOUNTER — Other Ambulatory Visit: Payer: Self-pay | Admitting: Family Medicine

## 2016-05-01 ENCOUNTER — Telehealth: Payer: Self-pay | Admitting: Family Medicine

## 2016-05-01 MED ORDER — CEFPROZIL 500 MG PO TABS: 500.0000 mg | ORAL_TABLET | Freq: Two times a day (BID) | ORAL | 0 refills | Status: DC

## 2016-05-01 NOTE — Telephone Encounter (Signed)
The husband relates that his wife's had head congestion drainage coughing sinus pressure not feeling good over the past several days unable to calm to be seen because of her work he requested an antibiotic we went ahead and prescribed an antibiotic Cefzil with warnings that if she starts having chest congestion coughing shortness of breath or need to be seen

## 2016-05-09 ENCOUNTER — Encounter: Payer: Self-pay | Admitting: Nurse Practitioner

## 2016-05-09 ENCOUNTER — Ambulatory Visit (INDEPENDENT_AMBULATORY_CARE_PROVIDER_SITE_OTHER): Payer: BLUE CROSS/BLUE SHIELD | Admitting: Nurse Practitioner

## 2016-05-09 VITALS — BP 114/80 | Temp 98.1°F | Ht 70.5 in | Wt 263.0 lb

## 2016-05-09 DIAGNOSIS — J310 Chronic rhinitis: Secondary | ICD-10-CM

## 2016-05-09 DIAGNOSIS — J329 Chronic sinusitis, unspecified: Secondary | ICD-10-CM | POA: Diagnosis not present

## 2016-05-09 MED ORDER — HYDROCODONE-HOMATROPINE 5-1.5 MG/5ML PO SYRP: 5.0000 mL | ORAL_SOLUTION | ORAL | 0 refills | Status: DC | PRN

## 2016-05-09 MED ORDER — LEVOFLOXACIN 500 MG PO TABS: 500.0000 mg | ORAL_TABLET | Freq: Every day | ORAL | 0 refills | Status: DC

## 2016-05-09 MED ORDER — METHYLPREDNISOLONE ACETATE 40 MG/ML IJ SUSP
40.0000 mg | Freq: Once | INTRAMUSCULAR | Status: AC
  Administered 2016-05-09: 40 mg via INTRAMUSCULAR

## 2016-05-10 ENCOUNTER — Encounter: Payer: Self-pay | Admitting: Nurse Practitioner

## 2016-05-10 NOTE — Progress Notes (Signed)
Subjective:  Presents for c/o sinus symptoms x 2 weeks. Had a fever of 103.4 first but has resolved. Sore throat. Headache. Frequent cough producing green/brown mucus. Rare wheezing. Ear pain. Has been on Cefzil for the past week with minimal improvement. Smokes one pack per day.  Objective:   BP 114/80   Temp 98.1 F (36.7 C) (Oral)   Ht 5' 10.5" (1.791 m)   Wt 263 lb (119.3 kg)   BMI 37.20 kg/m  NAD. Alert, oriented. TMs retracted bilat; moderate erythema on the left. Posterior pharynx erythema with PND noted. Neck supple with mild anterior adenopathy. Lungs clear. Heart RRR.   Assessment:   Plan:  Meds ordered this encounter  Medications  . levofloxacin (LEVAQUIN) 500 MG tablet    Sig: Take 1 tablet (500 mg total) by mouth daily.    Dispense:  10 tablet    Refill:  0    Order Specific Question:   Supervising Provider    Answer:   Merlyn AlbertLUKING, WILLIAM S [[id number]]  . HYDROcodone-homatropine (HYCODAN) 5-1.5 MG/5ML syrup    Sig: Take 5 mLs by mouth every 4 (four) hours as needed.    Dispense:  90 mL    Refill:  0    Order Specific Question:   Supervising Provider    Answer:   Merlyn AlbertLUKING, WILLIAM S [[id number]]  . methylPREDNISolone acetate (DEPO-MEDROL) injection 40 mg    Stop Cefzil.  OTC meds as directed. Call back next week if no improvement, sooner if worse.

## 2016-05-15 ENCOUNTER — Ambulatory Visit: Payer: Self-pay

## 2016-06-19 ENCOUNTER — Encounter: Payer: Self-pay | Admitting: Family Medicine

## 2016-06-19 ENCOUNTER — Ambulatory Visit (INDEPENDENT_AMBULATORY_CARE_PROVIDER_SITE_OTHER): Payer: BLUE CROSS/BLUE SHIELD | Admitting: Family Medicine

## 2016-06-19 VITALS — BP 122/86 | Temp 99.2°F | Ht 70.5 in | Wt 256.0 lb

## 2016-06-19 DIAGNOSIS — J329 Chronic sinusitis, unspecified: Secondary | ICD-10-CM

## 2016-06-19 DIAGNOSIS — J310 Chronic rhinitis: Secondary | ICD-10-CM

## 2016-06-19 MED ORDER — LEVOFLOXACIN 500 MG PO TABS: 500.0000 mg | ORAL_TABLET | Freq: Every day | ORAL | 0 refills | Status: AC

## 2016-06-19 NOTE — Progress Notes (Signed)
   Subjective:    Patient ID: Joanne Little, female    DOB: 12/01/1970, 46 y.o.   MRN: [id number]  Sinusitis  This is a new problem. Episode onset: 4 days. Associated symptoms include congestion, coughing, headaches and sinus pressure. (Nausea, diarrhea, fever, body aches ) Treatments tried: advil, tylenol.   mon aft rticke in the throat   tmax 102  Feeling bad since No flus shot this yr  Pos smoke iexpos   Using tylenol advil sinus    Cough ongoing   Ears feel fine  enery lelve l zero , app comes and goes  nt bad some off anon     Review of Systems  HENT: Positive for congestion and sinus pressure.   Respiratory: Positive for cough.   Neurological: Positive for headaches.       Objective:   Physical Exam Alert, mild malaise. Hydration good Vitals stable. frontal/ maxillary tenderness evident positive nasal congestion. pharynx normal neck supple  lungs clear/no crackles or wheezes. heart regular in rhythm        Assessment & Plan:  Impression rhinosinusitisLikely post flu with element of bronchitis also likely post viral, discussed with patient. plan antibiotics prescribed. Questions answered. Symptomatic care discussed. warning signs discussed. WSL

## 2017-02-23 ENCOUNTER — Ambulatory Visit (INDEPENDENT_AMBULATORY_CARE_PROVIDER_SITE_OTHER): Payer: BLUE CROSS/BLUE SHIELD | Admitting: Family Medicine

## 2017-02-23 ENCOUNTER — Encounter: Payer: Self-pay | Admitting: Family Medicine

## 2017-02-23 DIAGNOSIS — J069 Acute upper respiratory infection, unspecified: Secondary | ICD-10-CM

## 2017-02-23 DIAGNOSIS — J019 Acute sinusitis, unspecified: Secondary | ICD-10-CM | POA: Diagnosis not present

## 2017-02-23 MED ORDER — AMOXICILLIN-POT CLAVULANATE 875-125 MG PO TABS: 1.0000 | ORAL_TABLET | Freq: Two times a day (BID) | ORAL | 0 refills | Status: DC

## 2017-02-23 MED ORDER — BUPROPION HCL ER (SR) 150 MG PO TB12: 150.0000 mg | ORAL_TABLET | Freq: Two times a day (BID) | ORAL | 2 refills | Status: DC

## 2017-02-23 NOTE — Progress Notes (Signed)
   Subjective:    Patient ID: Joanne Little, female    DOB: 04-19-1971, 46 y.o.   MRN: [id number]  Cough  This is a new problem. The current episode started in the past 7 days. Associated symptoms include a fever, headaches, nasal congestion, rhinorrhea and a sore throat. Pertinent negatives include no chest pain, ear pain, shortness of breath or wheezing. Treatments tried: otc cold med.   Viral like illness for multiple days now with chest congestion coughing sinus pressure pain no wheezing or discomfort no shortness of breath did have some fevers patient is a smoker she like to be older quit smoking but does not one use Chantix because of history of postpartum depression currently on Celexa   Review of Systems  Constitutional: Positive for fever. Negative for activity change.  HENT: Positive for congestion, rhinorrhea and sore throat. Negative for ear pain.   Eyes: Negative for discharge.  Respiratory: Positive for cough. Negative for shortness of breath and wheezing.   Cardiovascular: Negative for chest pain.  Neurological: Positive for headaches.       Objective:   Physical Exam  Constitutional: She appears well-developed.  HENT:  Head: Normocephalic.  Right Ear: External ear normal.  Left Ear: External ear normal.  Nose: Nose normal.  Mouth/Throat: Oropharynx is clear and moist. No oropharyngeal exudate.  Eyes: Right eye exhibits no discharge. Left eye exhibits no discharge.  Neck: Neck supple. No tracheal deviation present.  Cardiovascular: Normal rate and normal heart sounds.   No murmur heard. Pulmonary/Chest: Effort normal and breath sounds normal. She has no wheezes. She has no rales.  Lymphadenopathy:    She has no cervical adenopathy.  Skin: Skin is warm and dry.  Nursing note and vitals reviewed.    Moderate sinus tenderness lungs are clear no respiratory distress     Assessment & Plan:  Viral syndrome Secondary rhinosinusitis Antibiotics prescribed warning  signs discussed follow-up if progressive troubles or worse patient encouraged quit smoking She would like to use Wellbutrin twice daily over the course of the next few months

## 2017-02-23 NOTE — Patient Instructions (Signed)
Coping with Quitting Smoking Quitting smoking is a physical and mental challenge. You will face cravings, withdrawal symptoms, and temptation. Before quitting, work with your health care provider to make a plan that can help you cope. Preparation can help you quit and keep you from giving in. How can I cope with cravings? Cravings usually last for 5-10 minutes. If you get through it, the craving will pass. Consider taking the following actions to help you cope with cravings:  Keep your mouth busy: ? Chew sugar-free gum. ? Suck on hard candies or a straw. ? Brush your teeth.  Keep your hands and body busy: ? Immediately change to a different activity when you feel a craving. ? Squeeze or play with a ball. ? Do an activity or a hobby, like making bead jewelry, practicing needlepoint, or working with wood. ? Mix up your normal routine. ? Take a short exercise break. Go for a quick walk or run up and down stairs. ? Spend time in public places where smoking is not allowed.  Focus on doing something kind or helpful for someone else.  Call a friend or family member to talk during a craving.  Join a support group.  Call a quit line, such as 1-800-QUIT-NOW.  Talk with your health care provider about medicines that might help you cope with cravings and make quitting easier for you.  How can I deal with withdrawal symptoms? Your body may experience negative effects as it tries to get used to not having nicotine in the system. These effects are called withdrawal symptoms. They may include:  Feeling hungrier than normal.  Trouble concentrating.  Irritability.  Trouble sleeping.  Feeling depressed.  Restlessness and agitation.  Craving a cigarette.  To manage withdrawal symptoms:  Avoid places, people, and activities that trigger your cravings.  Remember why you want to quit.  Get plenty of sleep.  Avoid coffee and other caffeinated drinks. These may worsen some of your  symptoms.  How can I handle social situations? Social situations can be difficult when you are quitting smoking, especially in the first few weeks. To manage this, you can:  Avoid parties, bars, and other social situations where people might be smoking.  Avoid alcohol.  Leave right away if you have the urge to smoke.  Explain to your family and friends that you are quitting smoking. Ask for understanding and support.  Plan activities with friends or family where smoking is not an option.  What are some ways I can cope with stress? Wanting to smoke may cause stress, and stress can make you want to smoke. Find ways to manage your stress. Relaxation techniques can help. For example:  Breathe slowly and deeply, in through your nose and out through your mouth.  Listen to soothing, relaxing music.  Talk with a family member or friend about your stress.  Light a candle.  Soak in a bath or take a shower.  Think about a peaceful place.  What are some ways I can prevent weight gain? Be aware that many people gain weight after they quit smoking. However, not everyone does. To keep from gaining weight, have a plan in place before you quit and stick to the plan after you quit. Your plan should include:  Having healthy snacks. When you have a craving, it may help to: ? Eat plain popcorn, crunchy carrots, celery, or other cut vegetables. ? Chew sugar-free gum.  Changing how you eat: ? Eat small portion sizes at meals. ?   Eat 4-6 small meals throughout the day instead of 1-2 large meals a day. ? Be mindful when you eat. Do not watch television or do other things that might distract you as you eat.  Exercising regularly: ? Make time to exercise each day. If you do not have time for a long workout, do short bouts of exercise for 5-10 minutes several times a day. ? Do some form of strengthening exercise, like weight lifting, and some form of aerobic exercise, like running or  swimming.  Drinking plenty of water or other low-calorie or no-calorie drinks. Drink 6-8 glasses of water daily, or as much as instructed by your health care provider.  Summary  Quitting smoking is a physical and mental challenge. You will face cravings, withdrawal symptoms, and temptation to smoke again. Preparation can help you as you go through these challenges.  You can cope with cravings by keeping your mouth busy (such as by chewing gum), keeping your body and hands busy, and making calls to family, friends, or a helpline for people who want to quit smoking.  You can cope with withdrawal symptoms by avoiding places where people smoke, avoiding drinks with caffeine, and getting plenty of rest.  Ask your health care provider about the different ways to prevent weight gain, avoid stress, and handle social situations. This information is not intended to replace advice given to you by your health care provider. Make sure you discuss any questions you have with your health care provider. Document Released: 05/09/2016 Document Revised: 05/09/2016 Document Reviewed: 05/09/2016 Elsevier Interactive Patient Education  2018 [employer]  

## 2017-03-19 ENCOUNTER — Other Ambulatory Visit: Payer: Self-pay | Admitting: *Deleted

## 2017-03-19 MED ORDER — BUPROPION HCL ER (SR) 150 MG PO TB12: 150.0000 mg | ORAL_TABLET | Freq: Two times a day (BID) | ORAL | 2 refills | Status: DC

## 2017-03-26 ENCOUNTER — Other Ambulatory Visit: Payer: Self-pay | Admitting: *Deleted

## 2017-03-26 MED ORDER — BUPROPION HCL ER (SR) 150 MG PO TB12: 150.0000 mg | ORAL_TABLET | Freq: Two times a day (BID) | ORAL | 0 refills | Status: DC

## 2017-06-18 ENCOUNTER — Other Ambulatory Visit: Payer: Self-pay | Admitting: *Deleted

## 2017-06-18 NOTE — Telephone Encounter (Signed)
30 day and 1 rf needs ov within 60 days

## 2017-06-19 MED ORDER — BUPROPION HCL ER (SR) 150 MG PO TB12: 150.0000 mg | ORAL_TABLET | Freq: Two times a day (BID) | ORAL | 1 refills | Status: DC

## 2017-07-10 ENCOUNTER — Telehealth: Payer: Self-pay | Admitting: Family Medicine

## 2017-07-10 MED ORDER — OSELTAMIVIR PHOSPHATE 75 MG PO CAPS: 75.0000 mg | ORAL_CAPSULE | Freq: Two times a day (BID) | ORAL | 0 refills | Status: DC

## 2017-07-10 NOTE — Telephone Encounter (Signed)
Husband and daughter saw Dr Brett CanalesSteve yesterday pm and were diagnosed with the flu. Now patient is having symptoms and would like tamiflu called in

## 2017-07-10 NOTE — Telephone Encounter (Signed)
Husband Joanne Little calling because he was seen yesterday and so was [name]. Both diagnosed with the flu.  Joanne Little came home sick today. Fever, cough, H/A, achey.  He wants to know if we can call in Tamiflu for [facility] to the [facility] [place].

## 2017-07-10 NOTE — Telephone Encounter (Signed)
Prescription sent electronically to pharmacy. Patient notified. 

## 2017-07-10 NOTE — Telephone Encounter (Signed)
Please send in Tamiflu 75 mg 1 twice daily for 5 days-certainly if progressive symptoms or if worse follow-up

## 2017-07-14 DIAGNOSIS — Z1231 Encounter for screening mammogram for malignant neoplasm of breast: Secondary | ICD-10-CM | POA: Diagnosis not present

## 2017-07-14 DIAGNOSIS — Z01419 Encounter for gynecological examination (general) (routine) without abnormal findings: Secondary | ICD-10-CM | POA: Diagnosis not present

## 2017-07-14 DIAGNOSIS — Z6836 Body mass index (BMI) 36.0-36.9, adult: Secondary | ICD-10-CM | POA: Diagnosis not present

## 2018-05-27 ENCOUNTER — Telehealth: Payer: Self-pay | Admitting: *Deleted

## 2018-05-27 DIAGNOSIS — A881 Epidemic vertigo: Secondary | ICD-10-CM | POA: Diagnosis not present

## 2018-05-27 NOTE — Telephone Encounter (Signed)
ok 

## 2018-05-27 NOTE — Telephone Encounter (Signed)
Pt called for appt. States she is having a bad case of vertigo. She had this one time before in her 31's. Room is spinning and its constant. Nausea, no headache. Offered pt a 9:50 appt. She could not make it at that time. Advised pt to go to urgent care due to not having any other appts available today. Pt states she will go to urgent care.

## 2018-07-06 ENCOUNTER — Ambulatory Visit: Payer: BLUE CROSS/BLUE SHIELD | Admitting: Family Medicine

## 2018-07-06 VITALS — BP 122/82 | Temp 98.2°F | Wt 248.2 lb

## 2018-07-06 DIAGNOSIS — Z13220 Encounter for screening for lipoid disorders: Secondary | ICD-10-CM | POA: Diagnosis not present

## 2018-07-06 DIAGNOSIS — J019 Acute sinusitis, unspecified: Secondary | ICD-10-CM

## 2018-07-06 DIAGNOSIS — B9689 Other specified bacterial agents as the cause of diseases classified elsewhere: Secondary | ICD-10-CM

## 2018-07-06 DIAGNOSIS — Z131 Encounter for screening for diabetes mellitus: Secondary | ICD-10-CM

## 2018-07-06 MED ORDER — AMOXICILLIN-POT CLAVULANATE 875-125 MG PO TABS: 1.0000 | ORAL_TABLET | Freq: Two times a day (BID) | ORAL | 0 refills | Status: DC

## 2018-07-06 NOTE — Progress Notes (Signed)
   Subjective:    Patient ID: Joanne Little, female    DOB: June 09, 1970, 48 y.o.   MRN: [id number]  Sinus Problem  This is a new problem. The current episode started in the past 7 days. Associated symptoms include congestion, coughing, ear pain, headaches and a sore throat. Pertinent negatives include no shortness of breath. (Abdominal pain and diarrhea)    Significant sinus congestion drainage pressure pain discomfort denies high fever chills sweats wheezing difficulty breathing  Review of Systems  Constitutional: Negative for activity change and fever.  HENT: Positive for congestion, ear pain, rhinorrhea and sore throat.   Eyes: Negative for discharge.  Respiratory: Positive for cough. Negative for shortness of breath and wheezing.   Cardiovascular: Negative for chest pain.  Neurological: Positive for headaches.       Objective:   Physical Exam Vitals signs and nursing note reviewed.  Constitutional:      Appearance: She is well-developed.  HENT:     Head: Normocephalic.     Nose: Nose normal.     Mouth/Throat:     Pharynx: No oropharyngeal exudate.  Neck:     Musculoskeletal: Neck supple.  Cardiovascular:     Rate and Rhythm: Normal rate.     Heart sounds: Normal heart sounds. No murmur.  Pulmonary:     Effort: Pulmonary effort is normal.     Breath sounds: Normal breath sounds. No wheezing.  Lymphadenopathy:     Cervical: No cervical adenopathy.  Skin:    General: Skin is warm and dry.           Assessment & Plan:  Patient was seen today for upper respiratory illness. It is felt that the patient is dealing with sinusitis.  Antibiotics were prescribed today. Importance of compliance with medication was discussed.  Symptoms should gradually resolve over the course of the next several days. If high fevers, progressive illness, difficulty breathing, worsening condition or failure for symptoms to improve over the next several days then the patient is to follow-up.  If  any emergent conditions the patient is to follow-up in the emergency department otherwise to follow-up in the office.  Augmentin 875 twice daily for 2 weeks  If ongoing trouble let us know  Patient does use tobacco products.  Patient knows they should quit.  Patient is aware that smoking/in use of tobacco products increases their risk of heart disease, cancer, and COPD- lung issues.  Patient has been counseled to quit smoking/tobacco products. Encourage patient to quit smoking she will think about Wellbutrin she will call us if she feels she needs it

## 2018-07-28 ENCOUNTER — Encounter: Payer: Self-pay | Admitting: Family Medicine

## 2018-07-28 ENCOUNTER — Ambulatory Visit: Payer: BLUE CROSS/BLUE SHIELD | Admitting: Family Medicine

## 2018-07-28 VITALS — BP 114/72 | Temp 98.5°F | Ht 70.0 in | Wt 246.0 lb

## 2018-07-28 DIAGNOSIS — R109 Unspecified abdominal pain: Secondary | ICD-10-CM

## 2018-07-28 DIAGNOSIS — A084 Viral intestinal infection, unspecified: Secondary | ICD-10-CM

## 2018-07-28 MED ORDER — ONDANSETRON HCL 8 MG PO TABS: 8.0000 mg | ORAL_TABLET | Freq: Three times a day (TID) | ORAL | 1 refills | Status: DC | PRN

## 2018-07-28 MED ORDER — DICYCLOMINE HCL 20 MG PO TABS: 20.0000 mg | ORAL_TABLET | Freq: Three times a day (TID) | ORAL | 1 refills | Status: DC | PRN

## 2018-07-28 NOTE — Progress Notes (Signed)
   Subjective:    Patient ID: Joanne Little, female    DOB: Jan 14, 1971, 48 y.o.   MRN: [id number]  HPI Patient is here today with complaints of abdominal cramps, nauesa,diarrhea, and back pain. States she started having this the other day after being exposed to a coworker who had this She states the symptoms have been on going since Tuesday morning.  She has been using Pepto,Advil, alkaseltzer. Significant abdominal cramps some diarrhea denies high fever chills sweats denies wheezing difficulty breathing Reports a coworker has the same symptoms,but coworker is running a fever.  Review of Systems  Constitutional: Negative for activity change, appetite change and fatigue.  HENT: Negative for congestion and rhinorrhea.   Respiratory: Negative for cough and shortness of breath.   Cardiovascular: Negative for chest pain and leg swelling.  Gastrointestinal: Negative for abdominal pain, nausea and vomiting.  Skin: Negative for color change.  Neurological: Negative for dizziness, weakness and headaches.  Psychiatric/Behavioral: Negative for agitation, behavioral problems and confusion.       Objective:   Physical Exam Vitals signs reviewed.  Constitutional:      General: She is not in acute distress. HENT:     Head: Normocephalic and atraumatic.  Eyes:     General:        Right eye: No discharge.        Left eye: No discharge.  Neck:     Trachea: No tracheal deviation.  Cardiovascular:     Rate and Rhythm: Normal rate and regular rhythm.     Heart sounds: Normal heart sounds. No murmur.  Pulmonary:     Effort: Pulmonary effort is normal. No respiratory distress.     Breath sounds: Normal breath sounds.  Abdominal:     General: Abdomen is flat.     Palpations: Abdomen is soft.     Tenderness: There is no abdominal tenderness.  Lymphadenopathy:     Cervical: No cervical adenopathy.  Skin:    General: Skin is warm and dry.  Neurological:     Mental Status: She is alert.   Coordination: Coordination normal.  Psychiatric:        Behavior: Behavior normal.           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

## 2018-07-30 ENCOUNTER — Telehealth: Payer: Self-pay | Admitting: Family Medicine

## 2018-07-30 NOTE — Telephone Encounter (Signed)
Patient giving update since appt on Wednesday, no more diarrhea but patient states her stomach pain remains, she states her abd feels like gas pain, no fever or vomiting.

## 2018-07-30 NOTE — Telephone Encounter (Signed)
I truly feel this will gradually get better if it is not doing better by Monday we will be writing some test Please give [place] update on Monday

## 2018-07-30 NOTE — Telephone Encounter (Signed)
Patient advised Dr Lorin Picket truly feels this will gradually get better if it is not doing better by Monday we will be writing some test Please give [place] update on Monday. Patient verbalized understanding.

## 2018-08-02 ENCOUNTER — Encounter: Payer: Self-pay | Admitting: Family Medicine

## 2018-08-02 ENCOUNTER — Ambulatory Visit: Payer: BLUE CROSS/BLUE SHIELD | Admitting: Family Medicine

## 2018-08-02 VITALS — Temp 98.2°F | Ht 70.0 in | Wt 249.0 lb

## 2018-08-02 DIAGNOSIS — J019 Acute sinusitis, unspecified: Secondary | ICD-10-CM | POA: Diagnosis not present

## 2018-08-02 DIAGNOSIS — B9689 Other specified bacterial agents as the cause of diseases classified elsewhere: Secondary | ICD-10-CM | POA: Diagnosis not present

## 2018-08-02 MED ORDER — CEFDINIR 300 MG PO CAPS: 300.0000 mg | ORAL_CAPSULE | Freq: Two times a day (BID) | ORAL | 0 refills | Status: DC

## 2018-08-02 NOTE — Progress Notes (Signed)
   Subjective:    Patient ID: Joanne Little, female    DOB: June 21, 1970, 48 y.o.   MRN: [id number]  Cough  This is a new problem. The current episode started in the past 7 days. Associated symptoms include nasal congestion and rhinorrhea. Pertinent negatives include no chest pain, ear pain, fever, shortness of breath or wheezing. Treatments tried: otc cold meds.    Patient sinus pressure pain discomfort had flu a few weeks ago had GI upset last week now with sinus pressure pain discomfort drainage not feeling good denies high fever chills  Review of Systems  Constitutional: Negative for activity change and fever.  HENT: Positive for congestion and rhinorrhea. Negative for ear pain.   Eyes: Negative for discharge.  Respiratory: Positive for cough. Negative for shortness of breath and wheezing.   Cardiovascular: Negative for chest pain.       Objective:   Physical Exam Vitals signs and nursing note reviewed.  Constitutional:      Appearance: She is well-developed.  HENT:     Head: Normocephalic.     Nose: Nose normal.     Mouth/Throat:     Pharynx: No oropharyngeal exudate.  Neck:     Musculoskeletal: Neck supple.  Cardiovascular:     Rate and Rhythm: Normal rate.     Heart sounds: Normal heart sounds. No murmur.  Pulmonary:     Effort: Pulmonary effort is normal.     Breath sounds: Normal breath sounds. No wheezing.  Lymphadenopathy:     Cervical: No cervical adenopathy.  Skin:    General: Skin is warm and dry.           Assessment & Plan:  Patient was seen today for upper respiratory illness. It is felt that the patient is dealing with sinusitis.  Antibiotics were prescribed today. Importance of compliance with medication was discussed.  Symptoms should gradually resolve over the course of the next several days. If high fevers, progressive illness, difficulty breathing, worsening condition or failure for symptoms to improve over the next several days then the patient  is to follow-up.  If any emergent conditions the patient is to follow-up in the emergency department otherwise to follow-up in the office.

## 2018-12-27 ENCOUNTER — Telehealth: Payer: Self-pay | Admitting: Family Medicine

## 2018-12-27 MED ORDER — BUPROPION HCL ER (SR) 150 MG PO TB12: 150.0000 mg | ORAL_TABLET | Freq: Two times a day (BID) | ORAL | 2 refills | Status: DC

## 2018-12-27 NOTE — Telephone Encounter (Signed)
Pt was told to call when she was ready to stop smoking. She is ready and would like Wellbutrin called in to [id number] - [place], [place]

## 2018-12-27 NOTE — Telephone Encounter (Signed)
Patient states she does not have a history of seizures and has take Wellbutrin before briefly but was not ready and stopped it but now is ready. Prescription sent electronically to pharmacy. Patient notified.

## 2018-12-27 NOTE — Telephone Encounter (Signed)
I agree with patient I would recommend the Wellbutrin SR 150 mg, #60, 1 twice daily, 2 refills Please verify with patient know history of seizures Reason for this question is if a person has a history of seizures they are not to take Wellbutrin If any problems regarding the quitting smoking let us know

## 2019-02-21 DIAGNOSIS — L738 Other specified follicular disorders: Secondary | ICD-10-CM | POA: Diagnosis not present

## 2019-02-21 DIAGNOSIS — Z85828 Personal history of other malignant neoplasm of skin: Secondary | ICD-10-CM | POA: Diagnosis not present

## 2019-02-21 DIAGNOSIS — L308 Other specified dermatitis: Secondary | ICD-10-CM | POA: Diagnosis not present

## 2019-02-21 DIAGNOSIS — Z08 Encounter for follow-up examination after completed treatment for malignant neoplasm: Secondary | ICD-10-CM | POA: Diagnosis not present

## 2019-02-21 DIAGNOSIS — L821 Other seborrheic keratosis: Secondary | ICD-10-CM | POA: Diagnosis not present

## 2019-03-26 ENCOUNTER — Other Ambulatory Visit: Payer: Self-pay | Admitting: Family Medicine

## 2019-03-28 NOTE — Telephone Encounter (Signed)
Went all the way back to 2015 and did not see a med check just sick visits

## 2019-03-29 NOTE — Telephone Encounter (Signed)
May have this refill needs follow-up office visit in person or virtual

## 2019-04-14 ENCOUNTER — Emergency Department (HOSPITAL_COMMUNITY)
Admission: EM | Admit: 2019-04-14 | Discharge: 2019-04-14 | Disposition: A | Discharge status: Home / Self Care | Payer: BC Managed Care – PPO | Attending: Emergency Medicine | Admitting: Emergency Medicine

## 2019-04-14 ENCOUNTER — Other Ambulatory Visit: Payer: Self-pay

## 2019-04-14 ENCOUNTER — Emergency Department (HOSPITAL_COMMUNITY): Payer: BC Managed Care – PPO

## 2019-04-14 ENCOUNTER — Encounter (HOSPITAL_COMMUNITY): Payer: Self-pay | Admitting: *Deleted

## 2019-04-14 DIAGNOSIS — F17210 Nicotine dependence, cigarettes, uncomplicated: Secondary | ICD-10-CM | POA: Insufficient documentation

## 2019-04-14 DIAGNOSIS — R0602 Shortness of breath: Secondary | ICD-10-CM | POA: Diagnosis not present

## 2019-04-14 DIAGNOSIS — Z7982 Long term (current) use of aspirin: Secondary | ICD-10-CM | POA: Insufficient documentation

## 2019-04-14 DIAGNOSIS — R079 Chest pain, unspecified: Secondary | ICD-10-CM

## 2019-04-14 DIAGNOSIS — Z79899 Other long term (current) drug therapy: Secondary | ICD-10-CM | POA: Diagnosis not present

## 2019-04-14 DIAGNOSIS — R0789 Other chest pain: Secondary | ICD-10-CM | POA: Insufficient documentation

## 2019-04-14 LAB — CBC
HCT: 42.4 % (ref 36.0–46.0)
Hemoglobin: 13.6 g/dL (ref 12.0–15.0)
MCH: 30.2 pg (ref 26.0–34.0)
MCHC: 32.1 g/dL (ref 30.0–36.0)
MCV: 94 fL (ref 80.0–100.0)
Platelets: 264 10*3/uL (ref 150–400)
RBC: 4.51 MIL/uL (ref 3.87–5.11)
RDW: 13.4 % (ref 11.5–15.5)
WBC: 11.4 10*3/uL — ABNORMAL HIGH (ref 4.0–10.5)
nRBC: 0 % (ref 0.0–0.2)

## 2019-04-14 LAB — BASIC METABOLIC PANEL
Anion gap: 7 (ref 5–15)
BUN: 16 mg/dL (ref 6–20)
CO2: 23 mmol/L (ref 22–32)
Calcium: 8.6 mg/dL — ABNORMAL LOW (ref 8.9–10.3)
Chloride: 106 mmol/L (ref 98–111)
Creatinine, Ser: 0.77 mg/dL (ref 0.44–1.00)
GFR calc Af Amer: >60 mL/min (ref >60)
GFR calc non Af Amer: >60 mL/min (ref >60)
Glucose, Bld: 94 mg/dL (ref 70–99)
Potassium: 3.7 mmol/L (ref 3.5–5.1)
Sodium: 136 mmol/L (ref 135–145)

## 2019-04-14 LAB — TROPONIN I (HIGH SENSITIVITY): Troponin I (High Sensitivity): <2 ng/L (ref <18)

## 2019-04-14 MED ORDER — SODIUM CHLORIDE 0.9% FLUSH
3.0000 mL | Freq: Once | INTRAVENOUS | Status: AC
  Administered 2019-04-14: 3 mL via INTRAVENOUS

## 2019-04-14 NOTE — ED Provider Notes (Signed)
[facility] EMERGENCY DEPARTMENT Provider Note   CSN: [id number] Arrival date & time: 04/14/19  1607     History   Chief Complaint Chief Complaint  Patient presents with  . Chest Pain    HPI Joanne Little is a 48 y.o. female.     Intermittent chest pain for 2 weeks, episodes lasting 1 to 2 hours not associated with any activity.  Review of systems positive for left arm numbness.  Minimal dyspnea.  No nausea or diaphoresis.  No previous cardiac history.  Cardiac risk factors include cigarette smoking and hypercholesterolemia.  No diabetes or hypertension.  No early cardiac death.  Severity is moderate.     Past Medical History:  Diagnosis Date  . Abnormal Pap smear   . Anxiety   . Headache(784.0)   . Urinary tract infection     Patient Active Problem List   Diagnosis Date Noted  . Supervision of normal pregnancy 12/19/2010  . Postpartum hemorrhage 12/19/2010    Past Surgical History:  Procedure Laterality Date  . NO PAST SURGERIES       OB History    Gravida  2   Para  1   Term  1   Preterm  0   AB  1   Living  1     SAB  1   TAB  0   Ectopic  0   Multiple  0   Live Births  1            Home Medications    Prior to Admission medications   Medication Sig Start Date End Date Taking? Authorizing Provider  aspirin 81 MG chewable tablet Chew 162 mg by mouth once.   Yes [provider]  citalopram (CELEXA) 20 MG tablet Take 20 mg by mouth every evening.    Yes [provider]  ibuprofen (ADVIL) 200 MG tablet Take 400 mg by mouth every 6 (six) hours as needed for mild pain or moderate pain.   Yes [provider]  buPROPion (WELLBUTRIN SR) 150 MG 12 hr tablet TAKE 1 TABLET BY MOUTH TWICE A DAY Patient not taking: Reported on 04/14/2019 03/29/19   Kathyrn Drown, MD    Family History History reviewed. No pertinent family history.  Social History Social History   Tobacco Use  . Smoking status: Current Every  Day Smoker    Packs/day: 0.25    Years: 26.00    Pack years: 6.50    Types: Cigarettes  . Smokeless tobacco: Never Used  Substance Use Topics  . Alcohol use: No  . Drug use: No     Allergies   Patient has no known allergies.   Review of Systems Review of Systems  All other systems reviewed and are negative.    Physical Exam Updated Vital Signs BP 128/89   Pulse 70   Resp 13   Ht 5\' 10"  (1.778 m)   Wt 113.4 kg   LMP 03/07/2010   SpO2 98%   BMI 35.87 kg/m   Physical Exam Vitals signs and nursing note reviewed.  Constitutional:      Appearance: She is well-developed.  HENT:     Head: Normocephalic and atraumatic.  Eyes:     Conjunctiva/sclera: Conjunctivae normal.  Neck:     Musculoskeletal: Neck supple.  Cardiovascular:     Rate and Rhythm: Normal rate and regular rhythm.  Pulmonary:     Effort: Pulmonary effort is normal.     Breath sounds: Normal  breath sounds.  Abdominal:     General: Bowel sounds are normal.     Palpations: Abdomen is soft.  Musculoskeletal: Normal range of motion.  Skin:    General: Skin is warm and dry.  Neurological:     General: No focal deficit present.     Mental Status: She is alert and oriented to person, place, and time.  Psychiatric:        Behavior: Behavior normal.      ED Treatments / Results  Labs (all labs ordered are listed, but only abnormal results are displayed) Labs Reviewed  BASIC METABOLIC PANEL  CBC  POC URINE PREG, ED  TROPONIN I (HIGH SENSITIVITY)  TROPONIN I (HIGH SENSITIVITY)    EKG EKG Interpretation  Date/Time:  Thursday April 14 2019 16:23:09 EST Ventricular Rate:  79 PR Interval:  170 QRS Duration: 94 QT Interval:  400 QTC Calculation: 458 R Axis:   -16 Text Interpretation: Normal sinus rhythm with sinus arrhythmia Incomplete right bundle branch block Borderline ECG Confirmed by Donnetta Hutching ([id number]) on 04/14/2019 4:43:00 PM   Radiology Dg Chest 2 View  Result Date: 04/14/2019  CLINICAL DATA:  Chest pain for several weeks, shortness of breath EXAM: CHEST - 2 VIEW COMPARISON:  None. FINDINGS: No consolidation, features of edema, pneumothorax, or effusion. Pulmonary vascularity is normally distributed. The cardiomediastinal contours are unremarkable. No acute osseous or soft tissue abnormality. IMPRESSION: No acute cardiopulmonary abnormality. Electronically Signed   By: Kreg Shropshire M.D.   On: 04/14/2019 19:04    Procedures Procedures (including critical care time)  Medications Ordered in ED Medications  sodium chloride flush (NS) 0.9 % injection 3 mL (3 mLs Intravenous Given 04/14/19 1952)     Initial Impression / Assessment and Plan / ED Course  I have reviewed the triage vital signs and the nursing notes.  Pertinent labs & imaging results that were available during my care of the patient were reviewed by me and considered in my medical decision making (see chart for details).        We will proceed with typical cardiac work-up.  Patient is hemodynamically stable.  1030: Recheck.  No chest pain.  Discussed all labs, EKG, chest x-ray.  Will follow up with cardiology.  Final Clinical Impressions(s) / ED Diagnoses   Final diagnoses:  Chest pain, unspecified type    ED Discharge Orders    None       Donnetta Hutching, MD 04/14/19 2244

## 2019-04-14 NOTE — Discharge Instructions (Addendum)
Test showed no life-threatening condition.  Recommend follow-up with cardiology.  Phone number given.

## 2019-04-14 NOTE — ED Triage Notes (Signed)
Pt with mid to left cp for past couple of weeks, numbness to left arm for 3 months.  + sob, denies N/V or diaphoresis.

## 2019-04-20 ENCOUNTER — Encounter: Payer: Self-pay | Admitting: Cardiovascular Disease

## 2019-04-20 ENCOUNTER — Other Ambulatory Visit: Payer: Self-pay

## 2019-04-20 ENCOUNTER — Ambulatory Visit: Payer: BC Managed Care – PPO | Admitting: Cardiovascular Disease

## 2019-04-20 VITALS — BP 143/98 | HR 82 | Temp 98.2°F | Ht 70.0 in | Wt 250.0 lb

## 2019-04-20 DIAGNOSIS — R030 Elevated blood-pressure reading, without diagnosis of hypertension: Secondary | ICD-10-CM | POA: Diagnosis not present

## 2019-04-20 DIAGNOSIS — R079 Chest pain, unspecified: Secondary | ICD-10-CM

## 2019-04-20 DIAGNOSIS — Z720 Tobacco use: Secondary | ICD-10-CM | POA: Diagnosis not present

## 2019-04-20 DIAGNOSIS — I209 Angina pectoris, unspecified: Secondary | ICD-10-CM | POA: Diagnosis not present

## 2019-04-20 MED ORDER — METOPROLOL TARTRATE 50 MG PO TABS: ORAL_TABLET | ORAL | 0 refills | Status: DC

## 2019-04-20 NOTE — Progress Notes (Signed)
CARDIOLOGY CONSULT NOTE  Patient ID: Joanne Little MRN: [id number] DOB/AGE: 50-Apr-1972 50 y.o.  Admit date: (Not on file) Primary Physician: Babs Sciara, MD  Reason for Consultation: Chest pain  HPI: Joanne Little is a 48 y.o. female who is being seen today for the evaluation of chest pain at the request of Luking, Jonna Coup, MD.   Past medical history includes high cholesterol and tobacco use.  She was evaluated in the ED for chest pain on 04/14/2019.  I reviewed all relevant documentation, labs and studies.  Troponin was normal.  White blood cells were elevated at 11.4.  Renal function was normal.  Chest x-ray showed no acute cardiopulmonary abnormalities.  I personally reviewed the ECG which demonstrated sinus rhythm with no ischemic ST segment or T wave abnormalities.  She has been experiencing chest pains in the upper left and upper right sides of her chest.  There is no radiation to the back or to the jaw.  She has occasionally felt a "pulled muscle "sensation in her left arm.  She has been extremely fatigued over the past year.  She says she sleeps well.  She denies leg swelling, orthopnea, palpitations and paroxysmal nocturnal dyspnea.  She smokes a pack of cigarettes daily.   Family history: Father died at age 90 of uncertain cause.  Mother has hypercholesterolemia.  No Known Allergies  Current Outpatient Medications  Medication Sig Dispense Refill  . citalopram (CELEXA) 20 MG tablet Take 20 mg by mouth every evening.     Marland Kitchen ibuprofen (ADVIL) 200 MG tablet Take 400 mg by mouth every 6 (six) hours as needed for mild pain or moderate pain.     No current facility-administered medications for this visit.     Past Medical History:  Diagnosis Date  . Abnormal Pap smear   . Anxiety   . Headache(784.0)   . Urinary tract infection     Past Surgical History:  Procedure Laterality Date  . NO PAST SURGERIES      Social History   Socioeconomic History   . Marital status: Married    Spouse name: Not on file  . Number of children: Not on file  . Years of education: Not on file  . Highest education level: Not on file  Occupational History  . Not on file  Social Needs  . Financial resource strain: Not on file  . Food insecurity    Worry: Not on file    Inability: Not on file  . Transportation needs    Medical: Not on file    Non-medical: Not on file  Tobacco Use  . Smoking status: Current Every Day Smoker    Packs/day: 0.25    Years: 26.00    Pack years: 6.50    Types: Cigarettes  . Smokeless tobacco: Never Used  Substance and Sexual Activity  . Alcohol use: No  . Drug use: No  . Sexual activity: Not Currently  Lifestyle  . Physical activity    Days per week: Not on file    Minutes per session: Not on file  . Stress: Not on file  Relationships  . Social Musician on phone: Not on file    Gets together: Not on file    Attends religious service: Not on file    Active member of club or organization: Not on file    Attends meetings of clubs or organizations: Not on file    Relationship  status: Not on file  . Intimate partner violence    Fear of current or ex partner: Not on file    Emotionally abused: Not on file    Physically abused: Not on file    Forced sexual activity: Not on file  Other Topics Concern  . Not on file  Social History Narrative  . Not on file      Current Meds  Medication Sig  . citalopram (CELEXA) 20 MG tablet Take 20 mg by mouth every evening.   Marland Kitchen ibuprofen (ADVIL) 200 MG tablet Take 400 mg by mouth every 6 (six) hours as needed for mild pain or moderate pain.      Review of systems complete and found to be negative unless listed above in HPI    Physical exam Blood pressure (!) 143/98, pulse 82, temperature 98.2 F (36.8 C), temperature source Temporal, height 5\' 10"  (1.778 m), weight 250 lb (113.4 kg), last menstrual period 03/07/2010, SpO2 98 %. General: NAD Neck: No JVD,  no thyromegaly or thyroid nodule.  Lungs: Clear to auscultation bilaterally with normal respiratory effort. CV: Nondisplaced PMI. Regular rate and rhythm, normal S1/S2, no S3/S4, no murmur.  No peripheral edema.  No carotid bruit.    Abdomen: Soft, nontender, no distention.  Skin: Intact without lesions or rashes.  Neurologic: Alert and oriented x 3.  Psych: Normal affect. Extremities: No clubbing or cyanosis.  HEENT: Normal.   ECG: Most recent ECG reviewed.   Labs: Lab Results  Component Value Date/Time   K 3.7 04/14/2019 07:45 PM   BUN 16 04/14/2019 07:45 PM   CREATININE 0.77 04/14/2019 07:45 PM   ALT 10 09/11/2010 03:25 AM   HGB 13.6 04/14/2019 07:45 PM     Lipids: No results found for: LDLCALC, LDLDIRECT, CHOL, TRIG, HDL      ASSESSMENT AND PLAN:  1.  Chest pain and fatigue: Uncertain etiology.  Risk factors include hypercholesterolemia and tobacco use.  I will arrange for coronary CT angiography.  2.  Elevated blood pressure: No known history of hypertension.  This will need further monitoring.  3.  Tobacco use: She smokes a pack of cigarettes daily.     Disposition: Follow up in 3 months  Signed: Kate Sable, M.D., F.A.C.C.  04/20/2019, 1:50 PM

## 2019-04-20 NOTE — Patient Instructions (Addendum)
Medication Instructions:  Your physician recommends that you continue on your current medications as directed. Please refer to the Current Medication list given to you today.   TAKE 1 50 MG LOPRESSOR 2 HOURS PRIOR TO TEST    Labwork: JUST BEFORE CORONARY CT BMET   Testing/Procedures: CORONARY CTA   Follow-Up: Your physician recommends that you schedule a follow-up appointment in: 3 MONTHS    Any Other Special Instructions Will Be Listed Below (If Applicable).     If you need a refill on your cardiac medications before your next appointment, please call your pharmacy.

## 2019-05-05 ENCOUNTER — Other Ambulatory Visit (INDEPENDENT_AMBULATORY_CARE_PROVIDER_SITE_OTHER): Payer: BC Managed Care – PPO | Admitting: *Deleted

## 2019-05-05 ENCOUNTER — Other Ambulatory Visit: Payer: Self-pay

## 2019-05-05 DIAGNOSIS — Z23 Encounter for immunization: Secondary | ICD-10-CM | POA: Diagnosis not present

## 2019-05-16 DIAGNOSIS — Z6835 Body mass index (BMI) 35.0-35.9, adult: Secondary | ICD-10-CM | POA: Diagnosis not present

## 2019-05-16 DIAGNOSIS — Z01419 Encounter for gynecological examination (general) (routine) without abnormal findings: Secondary | ICD-10-CM | POA: Diagnosis not present

## 2019-05-16 DIAGNOSIS — Z1231 Encounter for screening mammogram for malignant neoplasm of breast: Secondary | ICD-10-CM | POA: Diagnosis not present

## 2019-06-10 ENCOUNTER — Telehealth (HOSPITAL_COMMUNITY): Payer: Self-pay | Admitting: Emergency Medicine

## 2019-06-10 NOTE — Telephone Encounter (Signed)
Reaching out to patient to offer assistance regarding upcoming cardiac imaging study; pt verbalizes understanding of appt date/time, parking situation and where to check in, pre-test NPO status and medications ordered, and verified current allergies; name and call back number provided for further questions should they arise Angelito Hopping RN Navigator Cardiac Imaging [facility] [phone] office [phone] cell 

## 2019-06-14 ENCOUNTER — Other Ambulatory Visit: Payer: Self-pay

## 2019-06-14 ENCOUNTER — Ambulatory Visit (HOSPITAL_COMMUNITY)
Admission: RE | Admit: 2019-06-14 | Discharge: 2019-06-14 | Disposition: A | Discharge status: Home / Self Care | Payer: BC Managed Care – PPO | Source: Ambulatory Visit | Attending: Cardiovascular Disease | Admitting: Cardiovascular Disease

## 2019-06-14 DIAGNOSIS — I209 Angina pectoris, unspecified: Secondary | ICD-10-CM | POA: Diagnosis not present

## 2019-06-14 MED ORDER — NITROGLYCERIN 0.4 MG SL SUBL
0.8000 mg | SUBLINGUAL_TABLET | SUBLINGUAL | Status: DC | PRN
  Administered 2019-06-14: 0.8 mg via SUBLINGUAL

## 2019-06-14 MED ORDER — METOPROLOL TARTRATE 5 MG/5ML IV SOLN: 5.0000 mg | INTRAVENOUS | Status: DC | PRN

## 2019-06-14 MED ORDER — IOHEXOL 350 MG/ML SOLN
80.0000 mL | Freq: Once | INTRAVENOUS | Status: AC | PRN
  Administered 2019-06-14: 80 mL via INTRAVENOUS

## 2019-06-14 MED ORDER — NITROGLYCERIN 0.4 MG SL SUBL
SUBLINGUAL_TABLET | SUBLINGUAL | Status: AC
  Filled 2019-06-14: qty 2

## 2019-06-18 ENCOUNTER — Encounter: Payer: Self-pay | Admitting: Family Medicine

## 2019-06-22 ENCOUNTER — Ambulatory Visit (INDEPENDENT_AMBULATORY_CARE_PROVIDER_SITE_OTHER): Payer: BC Managed Care – PPO | Admitting: Family Medicine

## 2019-06-22 ENCOUNTER — Other Ambulatory Visit: Payer: Self-pay

## 2019-06-22 DIAGNOSIS — Z13220 Encounter for screening for lipoid disorders: Secondary | ICD-10-CM | POA: Diagnosis not present

## 2019-06-22 DIAGNOSIS — Z131 Encounter for screening for diabetes mellitus: Secondary | ICD-10-CM

## 2019-06-22 DIAGNOSIS — R911 Solitary pulmonary nodule: Secondary | ICD-10-CM | POA: Diagnosis not present

## 2019-06-22 DIAGNOSIS — I2510 Atherosclerotic heart disease of native coronary artery without angina pectoris: Secondary | ICD-10-CM | POA: Diagnosis not present

## 2019-06-22 MED ORDER — BUPROPION HCL ER (SR) 150 MG PO TB12: 150.0000 mg | ORAL_TABLET | Freq: Two times a day (BID) | ORAL | 3 refills | Status: DC

## 2019-06-22 NOTE — Progress Notes (Signed)
Subjective:    Patient ID: Joanne Little, female    DOB: 1971-04-20, 49 y.o.   MRN: [id number]  HPI  Patient calls to follow up on recent CT scan. Patient currently smoking one pack a day.  Patient with a small pulmonary nodule on a CT scan which was done recently This nodule only 6 mm it is recommended to follow-up again in 6 to 12 months Very important for the patient to quit smoking.  She states past couple days she stayed off the smoking she restarted her Wellbutrin and using nicotine gum She understands the importance of getting cholesterol under control as well Results for orders placed or performed during the hospital encounter of 54/00/86  Basic metabolic panel  Result Value Ref Range   Sodium 136 135 - 145 mmol/L   Potassium 3.7 3.5 - 5.1 mmol/L   Chloride 106 98 - 111 mmol/L   CO2 23 22 - 32 mmol/L   Glucose, Bld 94 70 - 99 mg/dL   BUN 16 6 - 20 mg/dL   Creatinine, Ser 0.77 0.44 - 1.00 mg/dL   Calcium 8.6 (L) 8.9 - 10.3 mg/dL   GFR calc non Af Amer >60 >60 mL/min   GFR calc Af Amer >60 >60 mL/min   Anion gap 7 5 - 15  CBC  Result Value Ref Range   WBC 11.4 (H) 4.0 - 10.5 K/uL   RBC 4.51 3.87 - 5.11 MIL/uL   Hemoglobin 13.6 12.0 - 15.0 g/dL   HCT 42.4 36.0 - 46.0 %   MCV 94.0 80.0 - 100.0 fL   MCH 30.2 26.0 - 34.0 pg   MCHC 32.1 30.0 - 36.0 g/dL   RDW 13.4 11.5 - 15.5 %   Platelets 264 150 - 400 K/uL   nRBC 0.0 0.0 - 0.2 %  Troponin I (High Sensitivity)  Result Value Ref Range   Troponin I (High Sensitivity) <2.0 <18 ng/L    Virtual Visit via Video Note  I connected with Joanne Little on 06/22/19 at  8:30 AM EST by a video enabled telemedicine application and verified that I am speaking with the correct person using two identifiers.  Location: Patient: home Provider: office   I discussed the limitations of evaluation and management by telemedicine and the availability of in person appointments. The patient expressed understanding and agreed to proceed.   History of Present Illness:    Observations/Objective:   Assessment and Plan:   Follow Up Instructions:    I discussed the assessment and treatment plan with the patient. The patient was provided an opportunity to ask questions and all were answered. The patient agreed with the plan and demonstrated an understanding of the instructions.   The patient was advised to call back or seek an in-person evaluation if the symptoms worsen or if the condition fails to improve as anticipated.  I provided 20 minutes of non-face-to-face time during this encounter.      Review of Systems  Constitutional: Negative for activity change and appetite change.  HENT: Negative for congestion and rhinorrhea.   Respiratory: Negative for cough and shortness of breath.   Cardiovascular: Negative for chest pain and leg swelling.  Gastrointestinal: Negative for abdominal pain, nausea and vomiting.  Skin: Negative for color change.  Neurological: Negative for dizziness and weakness.  Psychiatric/Behavioral: Negative for agitation and confusion.       Objective:   Physical Exam  Patient had virtual visit Appears to be in no distress Atraumatic Neuro  able to relate and oriented No apparent resp distress Color normal       Assessment & Plan:  Very important for the patient to stay away from smoking Wellbutrin twice daily over the next few months Nicotine gum as needed  Repeat CT scan chest in July it is in the reminder file  Coronary artery disease minimal but very important to get cholesterol under control labs ordered await results

## 2019-07-04 ENCOUNTER — Other Ambulatory Visit: Payer: Self-pay | Admitting: Physician Assistant

## 2019-07-04 ENCOUNTER — Encounter: Payer: Self-pay | Admitting: Family Medicine

## 2019-07-04 ENCOUNTER — Ambulatory Visit (INDEPENDENT_AMBULATORY_CARE_PROVIDER_SITE_OTHER): Payer: BC Managed Care – PPO | Admitting: Family Medicine

## 2019-07-04 ENCOUNTER — Other Ambulatory Visit: Payer: Self-pay

## 2019-07-04 VITALS — Ht 69.0 in | Wt 247.0 lb

## 2019-07-04 DIAGNOSIS — U071 COVID-19: Secondary | ICD-10-CM | POA: Diagnosis not present

## 2019-07-04 DIAGNOSIS — E669 Obesity, unspecified: Secondary | ICD-10-CM

## 2019-07-04 HISTORY — DX: Obesity, unspecified: E66.9

## 2019-07-04 NOTE — Progress Notes (Signed)
  I connected by phone with Joanne Little on 07/04/2019 at 1:21 PM to discuss the potential use of an new treatment for mild to moderate COVID-19 viral infection in non-hospitalized patients.  This patient is a 49 y.o. female that meets the FDA criteria for Emergency Use Authorization of bamlanivimab or casirivimab\imdevimab.  Has a (+) direct SARS-CoV-2 viral test result  Has mild or moderate COVID-19   Is ? 51 years of age and weighs ? 40 kg  Is NOT hospitalized due to COVID-19  Is NOT requiring oxygen therapy or requiring an increase in baseline oxygen flow rate due to COVID-19  Is within 10 days of symptom onset  Has at least one of the high risk factor(s) for progression to severe COVID-19 and/or hospitalization as defined in EUA.  Specific high risk criteria : BMI >/= 35   I have spoken and communicated the following to the patient or parent/caregiver:  1. FDA has authorized the emergency use of bamlanivimab and casirivimab\imdevimab for the treatment of mild to moderate COVID-19 in adults and pediatric patients with positive results of direct SARS-CoV-2 viral testing who are 10 years of age and older weighing at least 40 kg, and who are at high risk for progressing to severe COVID-19 and/or hospitalization.  2. The significant known and potential risks and benefits of bamlanivimab and casirivimab\imdevimab, and the extent to which such potential risks and benefits are unknown.  3. Information on available alternative treatments and the risks and benefits of those alternatives, including clinical trials.  4. Patients treated with bamlanivimab and casirivimab\imdevimab should continue to self-isolate and use infection control measures (e.g., wear mask, isolate, social distance, avoid sharing personal items, clean and disinfect "high touch" surfaces, and frequent handwashing) according to CDC guidelines.   5. The patient or parent/caregiver has the option to accept or refuse  bamlanivimab or casirivimab\imdevimab .  After reviewing this information with the patient, The patient agreed to proceed with receiving the bamlanimivab infusion and will be provided a copy of the Fact sheet prior to receiving the infusion.   Pt set up for 07/05/19 @ 10:30am. Sx onset 07/01/19. Directions given.   Cline Crock PA-C 07/04/2019 1:21 PM

## 2019-07-04 NOTE — Progress Notes (Signed)
   Subjective:    Patient ID: Joanne Little, female    DOB: [birth year], 49 y.o.   MRN: [id number]  HPI no taste or smell started 3 days ago. [facility] to [facility] [place] and had a covid test which came back positive yesterday. Headache and bodyaches started 3 days ago. Taking doxy described by a dentist last week and tylenol.  Started up just a few days ago he lost his sense of taste and smell then she did do test which came back positive she relates a little bit of a cough she does have obesity so therefore she is at risk of a more severe case she denies any chest tightness pressure pain currently.  PMH benign Virtual Visit via Telephone Note  I connected with Joanne Little on 07/04/19 at  9:30 AM EST by telephone and verified that I am speaking with the correct person using two identifiers.  Location: Patient: home Provider: office   I discussed the limitations, risks, security and privacy concerns of performing an evaluation and management service by telephone and the availability of in person appointments. I also discussed with the patient that there may be a patient responsible charge related to this service. The patient expressed understanding and agreed to proceed.   History of Present Illness:    Observations/Objective:   Assessment and Plan:   Follow Up Instructions:    I discussed the assessment and treatment plan with the patient. The patient was provided an opportunity to ask questions and all were answered. The patient agreed with the plan and demonstrated an understanding of the instructions.   The patient was advised to call back or seek an in-person evaluation if the symptoms worsen or if the condition fails to improve as anticipated.  I provided 16 minutes of non-face-to-face time during this encounter.       Review of Systems  Constitutional: Negative for activity change and fever.  HENT: Positive for congestion and rhinorrhea. Negative for ear pain.   Eyes:  Negative for discharge.  Respiratory: Positive for cough. Negative for shortness of breath and wheezing.   Cardiovascular: Negative for chest pain.       Objective:   Physical Exam  Today's visit was via telephone Physical exam was not possible for this visit       Assessment & Plan:  Patient with obesity BMI greater than 35 Patient also with Covid infection had positive test through [facility] over the weekend Currently having some coughing in the chest but denies chest pain or shortness of breath Referral to infusion center for consideration of monoclonal antibody infusion  I did encourage her to take vitamin C vitamin D at the sink while she is sick Self-isolation recommended Parameters of this were discussed as well as warning signs

## 2019-07-05 ENCOUNTER — Ambulatory Visit (HOSPITAL_COMMUNITY)
Admission: RE | Admit: 2019-07-05 | Discharge: 2019-07-05 | Disposition: A | Discharge status: Home / Self Care | Payer: BC Managed Care – PPO | Source: Ambulatory Visit | Attending: Pulmonary Disease | Admitting: Pulmonary Disease

## 2019-07-05 DIAGNOSIS — U071 COVID-19: Secondary | ICD-10-CM | POA: Diagnosis not present

## 2019-07-05 DIAGNOSIS — Z23 Encounter for immunization: Secondary | ICD-10-CM | POA: Diagnosis not present

## 2019-07-05 MED ORDER — METHYLPREDNISOLONE SODIUM SUCC 125 MG IJ SOLR: 125.0000 mg | Freq: Once | INTRAMUSCULAR | Status: DC | PRN

## 2019-07-05 MED ORDER — DIPHENHYDRAMINE HCL 50 MG/ML IJ SOLN: 50.0000 mg | Freq: Once | INTRAMUSCULAR | Status: DC | PRN

## 2019-07-05 MED ORDER — SODIUM CHLORIDE 0.9 % IV SOLN
INTRAVENOUS | Status: DC | PRN
  Administered 2019-07-05: 250 mL via INTRAVENOUS

## 2019-07-05 MED ORDER — ALBUTEROL SULFATE HFA 108 (90 BASE) MCG/ACT IN AERS: 2.0000 | INHALATION_SPRAY | Freq: Once | RESPIRATORY_TRACT | Status: DC | PRN

## 2019-07-05 MED ORDER — FAMOTIDINE IN NACL 20-0.9 MG/50ML-% IV SOLN: 20.0000 mg | Freq: Once | INTRAVENOUS | Status: DC | PRN

## 2019-07-05 MED ORDER — SODIUM CHLORIDE 0.9 % IV SOLN
700.0000 mg | Freq: Once | INTRAVENOUS | Status: AC
  Administered 2019-07-05: 700 mg via INTRAVENOUS
  Filled 2019-07-05: qty 20

## 2019-07-05 MED ORDER — EPINEPHRINE 0.3 MG/0.3ML IJ SOAJ: 0.3000 mg | Freq: Once | INTRAMUSCULAR | Status: DC | PRN

## 2019-07-05 NOTE — Progress Notes (Signed)
Patient ID: Joanne Little, female   DOB: 05-Feb-1971, 49 y.o.   MRN: [id number]   Diagnosis: COVID-19  Physician: Delford Field  Procedure: [facility] Infusion [facility] Med: bamlanivimab infusion - Provided patient with bamlanimivab fact sheet for patients, parents and caregivers prior to infusion.  Complications: No immediate complications noted.  Discharge: Discharged home   Shaune Spittle 07/05/2019

## 2019-07-05 NOTE — Discharge Instructions (Signed)

## 2019-07-12 ENCOUNTER — Ambulatory Visit: Payer: BC Managed Care – PPO | Admitting: Cardiovascular Disease

## 2019-08-04 ENCOUNTER — Ambulatory Visit: Payer: BC Managed Care – PPO | Admitting: Cardiovascular Disease

## 2019-08-04 ENCOUNTER — Other Ambulatory Visit: Payer: Self-pay

## 2019-08-04 ENCOUNTER — Encounter: Payer: Self-pay | Admitting: Cardiovascular Disease

## 2019-08-04 VITALS — BP 120/90 | HR 72 | Temp 98.7°F | Ht 70.0 in | Wt 254.0 lb

## 2019-08-04 DIAGNOSIS — R079 Chest pain, unspecified: Secondary | ICD-10-CM

## 2019-08-04 DIAGNOSIS — Z720 Tobacco use: Secondary | ICD-10-CM

## 2019-08-04 DIAGNOSIS — R030 Elevated blood-pressure reading, without diagnosis of hypertension: Secondary | ICD-10-CM | POA: Diagnosis not present

## 2019-08-04 NOTE — Progress Notes (Signed)
SUBJECTIVE: The patient presents for follow-up after undergoing cardiovascular testing for the evaluation of chest pain.  She has had no symptom recurrence.  She quit smoking 6 weeks ago.  She got COVID-19 and had fatigue, loss of taste, and loss of smell and has recovered from this.  Cardiac CT results reviewed below which showed mild, non-obstructive disease.      Review of Systems: As per "subjective", otherwise negative.  No Known Allergies  Current Outpatient Medications  Medication Sig Dispense Refill  . citalopram (CELEXA) 20 MG tablet Take 20 mg by mouth every evening.     Marland Kitchen ibuprofen (ADVIL) 200 MG tablet Take 400 mg by mouth every 6 (six) hours as needed for mild pain or moderate pain.     No current facility-administered medications for this visit.    Past Medical History:  Diagnosis Date  . Abnormal Pap smear   . Anxiety   . Headache(784.0)   . Obesity (BMI 35.0-39.9 without comorbidity) 07/04/2019  . Urinary tract infection     Past Surgical History:  Procedure Laterality Date  . NO PAST SURGERIES      Social History   Socioeconomic History  . Marital status: Married    Spouse name: Not on file  . Number of children: Not on file  . Years of education: Not on file  . Highest education level: Not on file  Occupational History  . Not on file  Tobacco Use  . Smoking status: Former Smoker    Packs/day: 0.25    Years: 26.00    Pack years: 6.50    Types: Cigarettes    Start date: 03/28/1985    Quit date: 06/30/2019    Years since quitting: 0.0  . Smokeless tobacco: Never Used  Substance and Sexual Activity  . Alcohol use: No  . Drug use: No  . Sexual activity: Not Currently  Other Topics Concern  . Not on file  Social History Narrative  . Not on file   Social Determinants of Health   Financial Resource Strain:   . Difficulty of Paying Living Expenses:   Food Insecurity:   . Worried About Charity fundraiser in the Last Year:   . Arts development officer in the Last Year:   Transportation Needs:   . Film/video editor (Medical):   Marland Kitchen Lack of Transportation (Non-Medical):   Physical Activity:   . Days of Exercise per Week:   . Minutes of Exercise per Session:   Stress:   . Feeling of Stress :   Social Connections:   . Frequency of Communication with Friends and Family:   . Frequency of Social Gatherings with Friends and Family:   . Attends Religious Services:   . Active Member of Clubs or Organizations:   . Attends Archivist Meetings:   Marland Kitchen Marital Status:   Intimate Partner Violence:   . Fear of Current or Ex-Partner:   . Emotionally Abused:   Marland Kitchen Physically Abused:   . Sexually Abused:     Barbarann Ehlers RN was present throughout the entirety of the encounter.  Vitals:   08/04/19 0810  BP: 120/90  Pulse: 72  Temp: 98.7 F (37.1 C)  SpO2: 98%  Weight: 254 lb (115.2 kg)  Height: 5\' 10"  (1.778 m)    Wt Readings from Last 3 Encounters:  08/04/19 254 lb (115.2 kg)  07/04/19 247 lb (112 kg)  04/20/19 250 lb (113.4 kg)  PHYSICAL EXAM General: NAD HEENT: Normal. Neck: No JVD, no thyromegaly. Lungs: Clear to auscultation bilaterally with normal respiratory effort. CV: Regular rate and rhythm, normal S1/S2, no S3/S4, no murmur. No pretibial or periankle edema.  No carotid bruit.   Abdomen: Soft, nontender, no distention.  Neurologic: Alert and oriented.  Psych: Normal affect. Skin: Normal. Musculoskeletal: No gross deformities.      Labs: Lab Results  Component Value Date/Time   K 3.7 04/14/2019 07:45 PM   BUN 16 04/14/2019 07:45 PM   CREATININE 0.77 04/14/2019 07:45 PM   ALT 10 09/11/2010 03:25 AM   HGB 13.6 04/14/2019 07:45 PM     Lipids: No results found for: LDLCALC, LDLDIRECT, CHOL, TRIG, HDL      Image quality: excellent.  Noise artifact is: Limited.  Coronary Arteries:  Normal coronary origin.  Right dominance.  Left main: The left main is a large caliber  vessel with a normal take off from the left coronary cusp that bifurcates to form a left anterior descending artery and a left circumflex artery. There is minimal, mixed density plaque in the distal left main (<25%).  Left anterior descending artery: The proximal LAD is patent. The mid LAD contains mild non-calcified plaque (25-49%). The distal LAD is patent. The LAD gives off 1 patent diagonal branch.  Left circumflex artery: The LCX is non-dominant and patent with no evidence of plaque or stenosis. The LCX gives off 1 obtuse marginal branch with minimal non-calcified plaque (<25%).  Right coronary artery: The RCA is dominant. The proximal RCA contains minimal non-calcified plaque (<25%). The mid RCA contains mild, mixed density plaque (25-49%). The distal RCA is patent. The RCA terminates as a PDA and right posterolateral branch, both contain minimal non-calcified plaque (<25%).  Right Atrium: Right atrial size is within normal limits.  Right Ventricle: The right ventricular cavity is within normal limits.  Left Atrium: Left atrial size is normal in size with no left atrial appendage filling defect.  Left Ventricle: The ventricular cavity size is within normal limits. There are no stigmata of prior infarction. There is no abnormal filling defect.  Pulmonary arteries: Normal in size without proximal filling defect.  Pulmonary veins: Normal pulmonary venous drainage.  Pericardium: Normal thickness with no significant effusion or calcium present.  Cardiac valves: The aortic valve is trileaflet without significant calcification. The mitral valve is normal structure without significant calcification.  Aorta: Normal caliber with no significant disease.  Extra-cardiac findings: See attached radiology report for non-cardiac structures.  IMPRESSION: 1. Coronary calcium score of 55. This was 92nd percentile for age and sex matched control.  2. Normal coronary  origin with right dominance.  3. Mild stenoses (25-49%) in mid LAD and mid RCA that are non-obstructive.  RECOMMENDATIONS: 1. Mild non-obstructive CAD (25-49%). Consider non-atherosclerotic causes of chest pain. Consider preventive therapy and risk factor Modification.    ASSESSMENT AND PLAN:  1.  Chest pain and fatigue: No symptom recurrence. Coronary CT angiography results reviewed above with mild, non-obstructive disease. I have recommended statin therapy for its pleiotropic effects for the purposes of primary prevention.  2.  Elevated blood pressure: No known history of hypertension. DBP mildly elevated. This will need further monitoring.  3.  Tobacco use: She quit smoking 6 weeks ago and I congratulated her on this.    Disposition: Follow up as needed   Prentice Docker, M.D., F.A.C.C.

## 2019-08-04 NOTE — Patient Instructions (Signed)
Medication Instructions:    Your physician recommends that you continue on your current medications as directed. Please refer to the Current Medication list given to you today.  Lab Work: None today  Testing/Procedures:None today    Follow-Up: As needed with Dr.Koneswaran   Thank you for choosing [facility] !

## 2019-11-02 ENCOUNTER — Telehealth: Payer: Self-pay | Admitting: Family Medicine

## 2019-11-02 NOTE — Telephone Encounter (Signed)
Please advise. Thank you

## 2019-11-02 NOTE — Telephone Encounter (Signed)
Patient was told to call back to remind doctor to setup CT scan of her lungs in July. Please advise

## 2019-11-02 NOTE — Telephone Encounter (Signed)
She will be due for a noncontrast CT scan of the chest July 19-through July 31 Reason for CT scan of the chest is a pleural-based nodule Patient is a smoker And radiology recommended follow-up CT scan 6 months from the last one  Please communicate to the patient and help coordinate this CAT scan

## 2019-11-03 ENCOUNTER — Other Ambulatory Visit: Payer: Self-pay | Admitting: *Deleted

## 2019-11-03 DIAGNOSIS — R911 Solitary pulmonary nodule: Secondary | ICD-10-CM

## 2019-11-03 DIAGNOSIS — F17200 Nicotine dependence, unspecified, uncomplicated: Secondary | ICD-10-CM

## 2019-11-03 NOTE — Telephone Encounter (Signed)
Discussed with pt and order for ct put in to be scheduled

## 2019-12-13 ENCOUNTER — Other Ambulatory Visit: Payer: Self-pay

## 2019-12-13 ENCOUNTER — Ambulatory Visit (HOSPITAL_COMMUNITY)
Admission: RE | Admit: 2019-12-13 | Discharge: 2019-12-13 | Disposition: A | Discharge status: Home / Self Care | Payer: BC Managed Care – PPO | Source: Ambulatory Visit | Attending: Family Medicine | Admitting: Family Medicine

## 2019-12-13 DIAGNOSIS — F17200 Nicotine dependence, unspecified, uncomplicated: Secondary | ICD-10-CM | POA: Insufficient documentation

## 2019-12-13 DIAGNOSIS — R911 Solitary pulmonary nodule: Secondary | ICD-10-CM | POA: Insufficient documentation

## 2019-12-15 ENCOUNTER — Encounter: Payer: Self-pay | Admitting: Family Medicine

## 2020-03-26 ENCOUNTER — Encounter: Payer: Self-pay | Admitting: Family Medicine

## 2020-03-26 ENCOUNTER — Other Ambulatory Visit: Payer: Self-pay

## 2020-03-26 ENCOUNTER — Ambulatory Visit: Payer: BC Managed Care – PPO | Admitting: Family Medicine

## 2020-03-26 VITALS — BP 128/80 | HR 100 | Temp 97.8°F | Ht 70.0 in | Wt 264.0 lb

## 2020-03-26 DIAGNOSIS — R300 Dysuria: Secondary | ICD-10-CM

## 2020-03-26 DIAGNOSIS — R739 Hyperglycemia, unspecified: Secondary | ICD-10-CM | POA: Diagnosis not present

## 2020-03-26 DIAGNOSIS — N3001 Acute cystitis with hematuria: Secondary | ICD-10-CM

## 2020-03-26 DIAGNOSIS — R350 Frequency of micturition: Secondary | ICD-10-CM | POA: Insufficient documentation

## 2020-03-26 DIAGNOSIS — Z13220 Encounter for screening for lipoid disorders: Secondary | ICD-10-CM

## 2020-03-26 DIAGNOSIS — E119 Type 2 diabetes mellitus without complications: Secondary | ICD-10-CM | POA: Diagnosis not present

## 2020-03-26 LAB — POCT URINALYSIS DIPSTICK
Blood, UA: POSITIVE
Glucose, UA: POSITIVE — AB
Spec Grav, UA: >=1.03 — AB (ref 1.010–1.025)
pH, UA: 5 (ref 5.0–8.0)

## 2020-03-26 LAB — POCT GLYCOSYLATED HEMOGLOBIN (HGB A1C): Hemoglobin A1C: 7.1 % — AB (ref 4.0–5.6)

## 2020-03-26 LAB — POCT GLUCOSE (DEVICE FOR HOME USE): POC Glucose: 292 mg/dl — AB (ref 70–99)

## 2020-03-26 MED ORDER — BLOOD GLUCOSE METER KIT: PACK | 0 refills | Status: DC

## 2020-03-26 MED ORDER — METFORMIN HCL 500 MG PO TABS: 500.0000 mg | ORAL_TABLET | Freq: Every day | ORAL | 0 refills | Status: DC

## 2020-03-26 MED ORDER — NITROFURANTOIN MONOHYD MACRO 100 MG PO CAPS: 100.0000 mg | ORAL_CAPSULE | Freq: Two times a day (BID) | ORAL | 0 refills | Status: DC

## 2020-03-26 NOTE — Patient Instructions (Addendum)
Blood sugar goals: Normal: < 100   With diabetes to be considered in good control: Fasting: 90-130 Before meals: < 160 Before bedtime: < 180  Monitor your blood sugar at different times of the day (once per day)  Start diet and exercise program as discussed.  Take blood sugar daily at different times of day and keep a log. Bring log with you.  Follow-up in 3 months, get labs drawn one week before that visit.   Urinary Tract Infection, Adult A urinary tract infection (UTI) is an infection of any part of the urinary tract. The urinary tract includes:  The kidneys.  The ureters.  The bladder.  The urethra. These organs make, store, and get rid of pee (urine) in the body. What are the causes? This is caused by germs (bacteria) in your genital area. These germs grow and cause swelling (inflammation) of your urinary tract. What increases the risk? You are more likely to develop this condition if:  You have a small, thin tube (catheter) to drain pee.  You cannot control when you pee or poop (incontinence).  You are female, and: ? You use these methods to prevent pregnancy:  A medicine that kills sperm (spermicide).  A device that blocks sperm (diaphragm). ? You have low levels of a female hormone (estrogen). ? You are pregnant.  You have genes that add to your risk.  You are sexually active.  You take antibiotic medicines.  You have trouble peeing because of: ? A prostate that is bigger than normal, if you are female. ? A blockage in the part of your body that drains pee from the bladder (urethra). ? A kidney stone. ? A nerve condition that affects your bladder (neurogenic bladder). ? Not getting enough to drink. ? Not peeing often enough.  You have other conditions, such as: ? Diabetes. ? A weak disease-fighting system (immune system). ? Sickle cell disease. ? Gout. ? Injury of the spine. What are the signs or symptoms? Symptoms of this condition  include:  Needing to pee right away (urgently).  Peeing often.  Peeing small amounts often.  Pain or burning when peeing.  Blood in the pee.  Pee that smells bad or not like normal.  Trouble peeing.  Pee that is cloudy.  Fluid coming from the vagina, if you are female.  Pain in the belly or lower back. Other symptoms include:  Throwing up (vomiting).  No urge to eat.  Feeling mixed up (confused).  Being tired and grouchy (irritable).  A fever.  Watery poop (diarrhea). How is this treated? This condition may be treated with:  Antibiotic medicine.  Other medicines.  Drinking enough water. Follow these instructions at home:  Medicines  Take over-the-counter and prescription medicines only as told by your doctor.  If you were prescribed an antibiotic medicine, take it as told by your doctor. Do not stop taking it even if you start to feel better. General instructions  Make sure you: ? Pee until your bladder is empty. ? Do not hold pee for a long time. ? Empty your bladder after sex. ? Wipe from front to back after pooping if you are a female. Use each tissue one time when you wipe.  Drink enough fluid to keep your pee pale yellow.  Keep all follow-up visits as told by your doctor. This is important. Contact a doctor if:  You do not get better after 1-2 days.  Your symptoms go away and then come back. Get help  right away if:  You have very bad back pain.  You have very bad pain in your lower belly.  You have a fever.  You are sick to your stomach (nauseous).  You are throwing up. Summary  A urinary tract infection (UTI) is an infection of any part of the urinary tract.  This condition is caused by germs in your genital area.  There are many risk factors for a UTI. These include having a small, thin tube to drain pee and not being able to control when you pee or poop.  Treatment includes antibiotic medicines for germs.  Drink enough  fluid to keep your pee pale yellow. This information is not intended to replace advice given to you by your health care provider. Make sure you discuss any questions you have with your health care provider. Document Revised: 04/29/2018 Document Reviewed: 11/19/2017 Elsevier Patient Education  2020 [employer].

## 2020-03-26 NOTE — Progress Notes (Signed)
Patient ID: Joanne Little, female    DOB: 04-16-1971, 49 y.o.   MRN: [id number]   Chief Complaint  Patient presents with  . Dysuria   Subjective:  CC: red and swollen vagina, possible UTI  Presents today for possible UTI.  Reports that she has been having painful urination, and vaginal irritation.  Patient quit smoking January 2021, has gained 30+ pound since that time. burning with urination, frequent urination, vaginal itching and irritation. Started 3 days ago. Tried monistat and made it worse.   Results for orders placed or performed in visit on 03/26/20  POCT urinalysis dipstick  Result Value Ref Range   Color, UA     Clarity, UA     Glucose, UA Positive (A) Negative   Bilirubin, UA     Ketones, UA     Spec Grav, UA >=1.030 (A) 1.010 - 1.025   Blood, UA positive    pH, UA 5.0 5.0 - 8.0   Protein, UA     Urobilinogen, UA     Nitrite, UA     Leukocytes, UA Small (1+) (A) Negative   Appearance     Odor    POCT glycosylated hemoglobin (Hb A1C)  Result Value Ref Range   Hemoglobin A1C 7.1 (A) 4.0 - 5.6 %   HbA1c POC (<> result, manual entry)     HbA1c, POC (prediabetic range)     HbA1c, POC (controlled diabetic range)    POCT Glucose (Device for Home Use)  Result Value Ref Range   Glucose Fasting, POC     POC Glucose 292 (A) 70 - 99 mg/dl       Medical History [place] has a past medical history of Abnormal Pap smear, Anxiety, Headache(784.0), Obesity (BMI 35.0-39.9 without comorbidity) (07/04/2019), and Urinary tract infection.   Outpatient Encounter Medications as of 03/26/2020  Medication Sig  . blood glucose meter kit and supplies Dispense based on patient and insurance preference. Use to check glucose once daily as directed. (FOR ICD-10 E11.9).  . citalopram (CELEXA) 20 MG tablet Take 20 mg by mouth every evening.   Marland Kitchen ibuprofen (ADVIL) 200 MG tablet Take 400 mg by mouth every 6 (six) hours as needed for mild pain or moderate pain.  . metFORMIN (GLUCOPHAGE) 500 MG  tablet Take 1 tablet (500 mg total) by mouth daily with breakfast.  . nitrofurantoin, macrocrystal-monohydrate, (MACROBID) 100 MG capsule Take 1 capsule (100 mg total) by mouth 2 (two) times daily.   No facility-administered encounter medications on file as of 03/26/2020.     Review of Systems  Constitutional: Negative for chills and fever.  Respiratory: Negative for shortness of breath.   Cardiovascular: Negative for chest pain.  Gastrointestinal: Negative for abdominal pain.  Genitourinary: Positive for dysuria, frequency and urgency. Negative for hematuria.       Labia irritated     Vitals BP 128/80   Pulse 100   Temp 97.8 F (36.6 C)   Ht '5\' 10"'  (1.778 m)   Wt 264 lb (119.7 kg)   LMP 03/07/2010   SpO2 99%   BMI 37.88 kg/m   Objective:   Physical Exam Vitals and nursing note reviewed.  Constitutional:      General: She is not in acute distress.    Appearance: Normal appearance. She is obese. She is not ill-appearing.  Cardiovascular:     Rate and Rhythm: Normal rate and regular rhythm.     Heart sounds: Normal heart sounds.  Pulmonary:  Effort: Pulmonary effort is normal.     Breath sounds: Normal breath sounds.  Skin:    General: Skin is warm and dry.  Neurological:     Mental Status: She is alert and oriented to person, place, and time.  Psychiatric:        Mood and Affect: Mood normal.        Behavior: Behavior normal.        Thought Content: Thought content normal.        Judgment: Judgment normal.    Results for orders placed or performed in visit on 03/26/20  POCT urinalysis dipstick  Result Value Ref Range   Color, UA     Clarity, UA     Glucose, UA Positive (A) Negative   Bilirubin, UA     Ketones, UA     Spec Grav, UA >=1.030 (A) 1.010 - 1.025   Blood, UA positive    pH, UA 5.0 5.0 - 8.0   Protein, UA     Urobilinogen, UA     Nitrite, UA     Leukocytes, UA Small (1+) (A) Negative   Appearance     Odor    POCT glycosylated hemoglobin  (Hb A1C)  Result Value Ref Range   Hemoglobin A1C 7.1 (A) 4.0 - 5.6 %   HbA1c POC (<> result, manual entry)     HbA1c, POC (prediabetic range)     HbA1c, POC (controlled diabetic range)    POCT Glucose (Device for Home Use)  Result Value Ref Range   Glucose Fasting, POC     POC Glucose 292 (A) 70 - 99 mg/dl     Assessment and Plan   1. Dysuria - POCT urinalysis dipstick  2. Frequent urination - POCT Glucose (Device for Home Use)  3. Elevated blood sugar - POCT glycosylated hemoglobin (Hb A1C)  4. Acute cystitis with hematuria - nitrofurantoin, macrocrystal-monohydrate, (MACROBID) 100 MG capsule; Take 1 capsule (100 mg total) by mouth 2 (two) times daily.  Dispense: 14 capsule; Refill: 0  5. New onset type 2 diabetes mellitus (HCC) - metFORMIN (GLUCOPHAGE) 500 MG tablet; Take 1 tablet (500 mg total) by mouth daily with breakfast.  Dispense: 90 tablet; Refill: 0 - Comprehensive Metabolic Panel (CMET) - Lipid panel; Future - Hemoglobin A1c; Future - Comprehensive metabolic panel; Future - Comprehensive metabolic panel - Hemoglobin A1c - Lipid panel  6. Screening, lipid - Lipid panel; Future - Lipid panel   Presents for presumed UTI, glucose noted in urine, nurse checked glucose, 292. POC A1c 7.1.  New onset diabetes:  will start Metformin 500 mg daily with the largest meal, side effects discussed. Will get complete metabolic panel today due to new diagnosis of diabetes. Discussed referral for diabetes nutrition education, she wishes to monitor her nutrition and lose weight on her own. She has successfully lost 80 pounds in the past, stopped smoking in January of this year, and has gained 30+ pounds back. She is motivated to make lifestyle modifications. Goals include: By the 30-monthfollow-up, she will lose 15 pounds, start a walking routine, with the goal of 150 minutes/week, she will also start tao bo exercise program. Prescription given for glucose meter, lancets, strips.  She will monitor her blood sugar daily, alternating the times of day. She will keep a log and bring to her 345-monthollow-up.  Urinary Tract Infection:  1+ leukocytes and blood in urine, will treat with Macrobid. She will use over-the-counter pain relief for her irritated perineum. She will  let me know if this treatment is not effective for her urinary symptoms and external discomfort.  Agrees with plan of care discussed today. Understands warning signs to seek further care: Fever, chest pain, shortness of breath, continued elevated blood sugars, any significant changes in health status. Understands to follow-up in 3 months, will get labs drawn 1 week prior to this visit. Results of today's complete metabolic panel will be called once results are available.  At 3 month follow-up, will review labs, blood sugar log, lifestyle modifications, and depending on progress, possibly increase Metformin dose.   Pecolia Ades, FNP-C 03/26/2020

## 2020-03-27 LAB — COMPREHENSIVE METABOLIC PANEL
ALT: 150 IU/L — ABNORMAL HIGH (ref 0–32)
AST: 103 IU/L — ABNORMAL HIGH (ref 0–40)
Albumin/Globulin Ratio: 1.8 (ref 1.2–2.2)
Albumin: 4.4 g/dL (ref 3.8–4.8)
Alkaline Phosphatase: 115 IU/L (ref 44–121)
BUN/Creatinine Ratio: 11 (ref 9–23)
BUN: 9 mg/dL (ref 6–24)
Bilirubin Total: 0.4 mg/dL (ref 0.0–1.2)
CO2: 22 mmol/L (ref 20–29)
Calcium: 9.5 mg/dL (ref 8.7–10.2)
Chloride: 102 mmol/L (ref 96–106)
Creatinine, Ser: 0.8 mg/dL (ref 0.57–1.00)
GFR calc Af Amer: 99 mL/min/{1.73_m2} (ref >59)
GFR calc non Af Amer: 86 mL/min/{1.73_m2} (ref >59)
Globulin, Total: 2.5 g/dL (ref 1.5–4.5)
Glucose: 247 mg/dL — ABNORMAL HIGH (ref 65–99)
Potassium: 4.3 mmol/L (ref 3.5–5.2)
Sodium: 138 mmol/L (ref 134–144)
Total Protein: 6.9 g/dL (ref 6.0–8.5)

## 2020-03-27 NOTE — Addendum Note (Signed)
Addended by: Metro Kung on: 03/27/2020 08:50 AM   Modules accepted: Orders

## 2020-04-03 ENCOUNTER — Other Ambulatory Visit: Payer: Self-pay | Admitting: Family Medicine

## 2020-04-03 ENCOUNTER — Other Ambulatory Visit: Payer: Self-pay | Admitting: *Deleted

## 2020-04-03 DIAGNOSIS — R748 Abnormal levels of other serum enzymes: Secondary | ICD-10-CM

## 2020-04-03 DIAGNOSIS — E119 Type 2 diabetes mellitus without complications: Secondary | ICD-10-CM

## 2020-04-16 ENCOUNTER — Ambulatory Visit (HOSPITAL_COMMUNITY)
Admission: RE | Admit: 2020-04-16 | Discharge: 2020-04-16 | Disposition: A | Discharge status: Home / Self Care | Payer: BC Managed Care – PPO | Source: Ambulatory Visit | Attending: Family Medicine | Admitting: Family Medicine

## 2020-04-16 ENCOUNTER — Other Ambulatory Visit: Payer: Self-pay

## 2020-04-16 DIAGNOSIS — K760 Fatty (change of) liver, not elsewhere classified: Secondary | ICD-10-CM | POA: Diagnosis not present

## 2020-04-16 DIAGNOSIS — R748 Abnormal levels of other serum enzymes: Secondary | ICD-10-CM | POA: Diagnosis not present

## 2020-04-16 DIAGNOSIS — R945 Abnormal results of liver function studies: Secondary | ICD-10-CM | POA: Diagnosis not present

## 2020-04-16 DIAGNOSIS — K8020 Calculus of gallbladder without cholecystitis without obstruction: Secondary | ICD-10-CM | POA: Diagnosis not present

## 2020-04-24 DIAGNOSIS — E119 Type 2 diabetes mellitus without complications: Secondary | ICD-10-CM | POA: Diagnosis not present

## 2020-04-24 DIAGNOSIS — Z13220 Encounter for screening for lipoid disorders: Secondary | ICD-10-CM | POA: Diagnosis not present

## 2020-04-24 DIAGNOSIS — R748 Abnormal levels of other serum enzymes: Secondary | ICD-10-CM | POA: Diagnosis not present

## 2020-04-25 LAB — COMPREHENSIVE METABOLIC PANEL
ALT: 91 IU/L — ABNORMAL HIGH (ref 0–32)
AST: 54 IU/L — ABNORMAL HIGH (ref 0–40)
Albumin/Globulin Ratio: 1.7 (ref 1.2–2.2)
Albumin: 4.3 g/dL (ref 3.8–4.9)
Alkaline Phosphatase: 72 IU/L (ref 44–121)
BUN/Creatinine Ratio: 17 (ref 9–23)
BUN: 15 mg/dL (ref 6–24)
Bilirubin Total: 0.5 mg/dL (ref 0.0–1.2)
CO2: 20 mmol/L (ref 20–29)
Calcium: 9.3 mg/dL (ref 8.7–10.2)
Chloride: 104 mmol/L (ref 96–106)
Creatinine, Ser: 0.88 mg/dL (ref 0.57–1.00)
GFR calc Af Amer: 88 mL/min/{1.73_m2} (ref >59)
GFR calc non Af Amer: 76 mL/min/{1.73_m2} (ref >59)
Globulin, Total: 2.5 g/dL (ref 1.5–4.5)
Glucose: 118 mg/dL — ABNORMAL HIGH (ref 65–99)
Potassium: 4.5 mmol/L (ref 3.5–5.2)
Sodium: 138 mmol/L (ref 134–144)
Total Protein: 6.8 g/dL (ref 6.0–8.5)

## 2020-04-25 LAB — LIPID PANEL
Chol/HDL Ratio: 5.8 ratio — ABNORMAL HIGH (ref 0.0–4.4)
Cholesterol, Total: 220 mg/dL — ABNORMAL HIGH (ref 100–199)
HDL: 38 mg/dL — ABNORMAL LOW (ref >39)
LDL Chol Calc (NIH): 163 mg/dL — ABNORMAL HIGH (ref 0–99)
Triglycerides: 105 mg/dL (ref 0–149)
VLDL Cholesterol Cal: 19 mg/dL (ref 5–40)

## 2020-04-25 LAB — HEPATIC FUNCTION PANEL
ALT: 96 IU/L — ABNORMAL HIGH (ref 0–32)
AST: 54 IU/L — ABNORMAL HIGH (ref 0–40)
Albumin: 4.6 g/dL (ref 3.8–4.9)
Alkaline Phosphatase: 72 IU/L (ref 44–121)
Bilirubin Total: 0.5 mg/dL (ref 0.0–1.2)
Bilirubin, Direct: 0.15 mg/dL (ref 0.00–0.40)
Total Protein: 6.8 g/dL (ref 6.0–8.5)

## 2020-04-25 LAB — HEMOGLOBIN A1C
Est. average glucose Bld gHb Est-mCnc: 143 mg/dL
Hgb A1c MFr Bld: 6.6 % — ABNORMAL HIGH (ref 4.8–5.6)

## 2020-05-01 ENCOUNTER — Encounter: Payer: Self-pay | Admitting: Family Medicine

## 2020-05-01 ENCOUNTER — Ambulatory Visit: Payer: BC Managed Care – PPO | Admitting: Family Medicine

## 2020-05-01 ENCOUNTER — Other Ambulatory Visit: Payer: Self-pay

## 2020-05-01 VITALS — BP 122/68 | HR 99 | Temp 95.8°F | Wt 256.4 lb

## 2020-05-01 DIAGNOSIS — E785 Hyperlipidemia, unspecified: Secondary | ICD-10-CM

## 2020-05-01 DIAGNOSIS — E1169 Type 2 diabetes mellitus with other specified complication: Secondary | ICD-10-CM | POA: Diagnosis not present

## 2020-05-01 DIAGNOSIS — Z23 Encounter for immunization: Secondary | ICD-10-CM

## 2020-05-01 DIAGNOSIS — R748 Abnormal levels of other serum enzymes: Secondary | ICD-10-CM

## 2020-05-01 DIAGNOSIS — K760 Fatty (change of) liver, not elsewhere classified: Secondary | ICD-10-CM | POA: Diagnosis not present

## 2020-05-01 DIAGNOSIS — E119 Type 2 diabetes mellitus without complications: Secondary | ICD-10-CM | POA: Diagnosis not present

## 2020-05-01 MED ORDER — METFORMIN HCL 500 MG PO TABS: 500.0000 mg | ORAL_TABLET | Freq: Every day | ORAL | 1 refills | Status: DC

## 2020-05-01 MED ORDER — METFORMIN HCL 500 MG PO TABS: 500.0000 mg | ORAL_TABLET | Freq: Every day | ORAL | 0 refills | Status: DC

## 2020-05-01 NOTE — Patient Instructions (Signed)
  This is some additional information regarding Covid vaccine.  The Covid vaccine-specifically Pfizer and Moderna-help program the body to help recognize Covid to lessen the chance of getting sick with Covid plus also dramatically lessen the risk of a severe outcome.  These vaccines require 2 shots spaced several weeks apart.  The vaccine utilizes mRNA technology and does not interact with our DNA.  The research for these vaccines started back in the 1990s with mRNA research and technology.  The use of mRNA technology for vaccines was actually developed back during SARS infection of [place] in 2005.  Because this virus did not go worldwide there was never a need to utilize a vaccine.  The vaccine simply instructs our immune system how to recognize Covid virus thus lessening our risk of severe illness.  The studies show that vaccines are generally very well-tolerated.  It is common to have some mild side effects such as soreness at the site of the injection, for some individuals low-grade fever body aches headaches nausea diarrhea not feeling good for 2 to 3 days after the injection.  These moderate side effects are more likely to occur with the second shot.  Within 2 weeks of completing the second shot antibody levels are at a good level. Study showed that since June 2020 90% of those hospitalized for Covid were unvaccinated.  96% of those who died of Covid were unvaccinated.  The risk of dying from Covid for those who are vaccinated and under age 50 approaches 0%.  Most people who get sick with Covid will gradually get better but there is a randomness to the illness in which even younger individuals can have a severe case and end up in the hospital with Covid.  It is true that the older we are, Covid poses more risk.  Unfortunately some of the individuals who have severe cases or die from Covid have very little underlying health issues and are not considered "elderly ".1 in 7 individuals who have  Covid continue to have some symptoms of long-term Covid.  Sometimes this can be altered smell.  There are some this can result with long-term fatigue tiredness or headaches.  Also it has been shown.  Scientific studies that Covid can trigger heart attacks and strokes.  Having a Covid vaccine will not protect one against all Covid infections.  Approximately 20 to 30% of individuals who have had vaccines may get a amount of breakthrough case.  The way I look upon this-most of us drive cars that have seatbelts and in some cases airbags.  Every day we drive, we take a risk of potentially being in the traffic accident.  Having a seatbelt and airbag does not mean that we will not get in a traffic accident.  But having a seatbelt and the airbag can dramatically lower our risk of a severe outcome that could put us in the hospital or cause us to die from a auto accident.  In the same way having the Covid vaccine will lessen our risk of getting sick with Covid but if we are unlucky enough to get Covid the chance of being hospitalized or dying from it is tremendously lower.  So just as I would not feel good about disengaging my seatbelt and disabling my airbag-I would not feel good about living in a pandemic and not having the vaccine.  It is not unusual for viruses to mutate and therefore it is not unusual to have to sometimes redesign vaccines or get   a booster in order to maintain effectiveness of the vaccine.  I have had several individuals who have stated that they are not around others and so therefore they do not need to get the vaccine.  The difficult part is-this virus can infect some without triggering symptoms-therefore we can get exposed by being around a person who looks healthy-yet they are spreading the virus.  Every person is at risk of getting Covid.  I certainly recognize that many patients have difficulty deciding whether or not to do the vaccine.  Unfortunately there is a lot of information being  pushed forward on the news media, social media such as Facebook, and discussion of all friends and family that can be confusing for the average person to figure out.  It is important to realize that this information that we are exposed to falls into 3 categories. Category #1-on target information-this is typically information that is shown by scientific studies, or stated forth by experts within their fields. Category #2-misinformation-this is information that is well intended but is not an accurate reflection of what is really the case. Category #3-dis-information-this is information that is purposefully off base.  It is designed to confuse and discourage an individual from making a proper decision.  Through the past year and a half I have heard many different reasons why people do not get Covid vaccines.  Some of these reasons are a reflection of a person's deeply held beliefs.  Other reasons are based upon misinformation propagated in social media.  When making this decision it is extremely important to weed through all the noise!   Through my years of training, it has helped me to look at public health issues and treatment options from a medical perspective.  It is my firm belief that this vaccine is safe (I personally favor Moderna but Pfizer vaccine is also good).  Please do not underestimate this virus.  It is my sincere wish that all of our patients stay healthy and do not suffer tragic outcomes from this virus.  Please consider getting the vaccine if you have not already done so.  If you should have further questions please let us know.  Thanks-Dr. Marayah Higdon  

## 2020-05-01 NOTE — Progress Notes (Signed)
Subjective:    Patient ID: Joanne Little, female    DOB: 03-01-1971, 49 y.o.   MRN: [id number] Very nice patient HPI  Patient quit smoking approximately a year ago She has been working hard on her diet eliminating starches exercising more losing weight She brings in her sugar readings which overall look very good She does have fatty liver she had a recent ultrasound and recent lab work which shows elevated liver enzymes as well as elevated LDL she has a family history of this and also ultrasound which shows fatty liver Her dad died of liver disease in did have a history of alcohol use early on in life but not close to his death Patient is determined to get things improved via exercise watching diet losing weight and taking her medicines  Pt here for follow up on DM and blood work. Checking sugars regular each day. Pt taking Metformin 500 mg with vision. Doing well with med.  Results for orders placed or performed in visit on 04/03/20  Hepatic function panel  Result Value Ref Range   Total Protein 6.8 6.0 - 8.5 g/dL   Albumin 4.6 3.8 - 4.9 g/dL   Bilirubin Total 0.5 0.0 - 1.2 mg/dL   Bilirubin, Direct 6.44 0.00 - 0.40 mg/dL   Alkaline Phosphatase 72 44 - 121 IU/L   AST 54 (H) 0 - 40 IU/L   ALT 96 (H) 0 - 32 IU/L     Review of Systems  Constitutional: Negative for activity change, appetite change and fatigue.  HENT: Negative for congestion and rhinorrhea.   Respiratory: Negative for cough and shortness of breath.   Cardiovascular: Negative for chest pain and leg swelling.  Gastrointestinal: Negative for abdominal pain and diarrhea.  Endocrine: Negative for polydipsia and polyphagia.  Skin: Negative for color change.  Neurological: Negative for dizziness and weakness.  Psychiatric/Behavioral: Negative for behavioral problems and confusion.       Objective:   Physical Exam Vitals reviewed.  Constitutional:      General: She is not in acute distress. HENT:     Head:  Normocephalic and atraumatic.  Eyes:     General:        Right eye: No discharge.        Left eye: No discharge.  Neck:     Trachea: No tracheal deviation.  Cardiovascular:     Rate and Rhythm: Normal rate and regular rhythm.     Heart sounds: Normal heart sounds. No murmur heard.   Pulmonary:     Effort: Pulmonary effort is normal. No respiratory distress.     Breath sounds: Normal breath sounds.  Lymphadenopathy:     Cervical: No cervical adenopathy.  Skin:    General: Skin is warm and dry.  Neurological:     Mental Status: She is alert.     Coordination: Coordination normal.  Psychiatric:        Behavior: Behavior normal.           Assessment & Plan:  1. New onset type 2 diabetes mellitus (HCC) Doing well on Metformin.  We will relook at A1c again in 3 months.  We will also relook at how well she is doing losing weight as well as liver enzymes going down If ongoing issues and not seeing progress consider GLP-1 treatment - metFORMIN (GLUCOPHAGE) 500 MG tablet; Take 1 tablet (500 mg total) by mouth daily with breakfast.  Dispense: 90 tablet; Refill: 1 - Basic metabolic panel - Hemoglobin A1c  2. Elevated liver enzymes Fatty liver additional lab work ordered ultrasound did show fatty liver patient is losing weight which is helpful - Hepatic function panel - ANA - Ceruloplasmin - Ferritin - Iron Binding Cap (TIBC)([facility]) - Hepatitis B Surface AntiGEN - Hepatitis C Antibody - Hepatitis B Core Antibody, total - Hepatitis B Surface AntiBODY - Hepatitis A antibody, total - Anti-Smooth Muscle Ab by IFA  3. Fatty liver Please see above.  Patient is aware that it is important to get this under control to lessen the risk of cirrhosis no sign of any cancer - Hepatic function panel - ANA - Ceruloplasmin - Ferritin - Iron Binding Cap (TIBC)([facility]) - Hepatitis B Surface AntiGEN - Hepatitis C Antibody - Hepatitis B Core Antibody, total - Hepatitis  B Surface AntiBODY - Hepatitis A antibody, total - Anti-Smooth Muscle Ab by IFA  4. Hyperlipidemia associated with type 2 diabetes mellitus (HCC) Would like to see an improvement with her liver enzymes before starting statin.  Will discuss this a second time at follow-up in 3 months - Lipid panel  5. Need for vaccination Pneumonia vaccine but patient defers on flu shot and Covid vaccine - Pneumococcal polysaccharide vaccine 23-valent greater than or equal to 2yo subcutaneous/IM

## 2020-05-02 NOTE — Progress Notes (Signed)
Called pt and she states her gyn is handling her colonscopy and she does not want [place] to set it up for her at this time.

## 2020-05-02 NOTE — Progress Notes (Signed)
Called pt and she states her gyn is handling her mammograms and she does not want [place] to set one up for her at this time

## 2020-05-29 DIAGNOSIS — Z01419 Encounter for gynecological examination (general) (routine) without abnormal findings: Secondary | ICD-10-CM | POA: Diagnosis not present

## 2020-05-29 DIAGNOSIS — Z1329 Encounter for screening for other suspected endocrine disorder: Secondary | ICD-10-CM | POA: Diagnosis not present

## 2020-05-29 DIAGNOSIS — Z0000 Encounter for general adult medical examination without abnormal findings: Secondary | ICD-10-CM | POA: Diagnosis not present

## 2020-05-29 DIAGNOSIS — Z6836 Body mass index (BMI) 36.0-36.9, adult: Secondary | ICD-10-CM | POA: Diagnosis not present

## 2020-05-29 DIAGNOSIS — N951 Menopausal and female climacteric states: Secondary | ICD-10-CM | POA: Diagnosis not present

## 2020-06-15 ENCOUNTER — Other Ambulatory Visit: Payer: Self-pay | Admitting: Family Medicine

## 2020-06-16 ENCOUNTER — Other Ambulatory Visit: Payer: Self-pay | Admitting: Family Medicine

## 2020-06-16 DIAGNOSIS — E119 Type 2 diabetes mellitus without complications: Secondary | ICD-10-CM

## 2020-06-18 ENCOUNTER — Encounter: Payer: Self-pay | Admitting: Family Medicine

## 2020-06-18 ENCOUNTER — Other Ambulatory Visit: Payer: Self-pay | Admitting: *Deleted

## 2020-06-18 DIAGNOSIS — E119 Type 2 diabetes mellitus without complications: Secondary | ICD-10-CM

## 2020-06-18 MED ORDER — METFORMIN HCL 500 MG PO TABS: 500.0000 mg | ORAL_TABLET | Freq: Every day | ORAL | 0 refills | Status: DC

## 2020-06-19 ENCOUNTER — Other Ambulatory Visit: Payer: Self-pay | Admitting: *Deleted

## 2020-06-19 DIAGNOSIS — E119 Type 2 diabetes mellitus without complications: Secondary | ICD-10-CM

## 2020-06-19 NOTE — Telephone Encounter (Signed)
Nurses It would be fine to go ahead and do the referral Also patient has lab work that she should do before her follow-up office visit in March thank you

## 2020-06-20 ENCOUNTER — Other Ambulatory Visit: Payer: Self-pay | Admitting: Family Medicine

## 2020-06-25 DIAGNOSIS — Z1231 Encounter for screening mammogram for malignant neoplasm of breast: Secondary | ICD-10-CM | POA: Diagnosis not present

## 2020-06-25 DIAGNOSIS — N950 Postmenopausal bleeding: Secondary | ICD-10-CM | POA: Diagnosis not present

## 2020-06-28 ENCOUNTER — Other Ambulatory Visit: Payer: Self-pay

## 2020-06-28 ENCOUNTER — Telehealth: Payer: Self-pay

## 2020-06-28 ENCOUNTER — Ambulatory Visit (INDEPENDENT_AMBULATORY_CARE_PROVIDER_SITE_OTHER): Payer: BC Managed Care – PPO | Admitting: Family Medicine

## 2020-06-28 DIAGNOSIS — U071 COVID-19: Secondary | ICD-10-CM

## 2020-06-28 MED ORDER — MOLNUPIRAVIR EUA 200MG CAPSULE: 4.0000 | ORAL_CAPSULE | Freq: Two times a day (BID) | ORAL | 0 refills | Status: AC

## 2020-06-28 NOTE — Telephone Encounter (Signed)
Per Dr Lorin Picket: Recommend Office visit today. Patient scheduled office visit today with Dr Lorin Picket

## 2020-06-28 NOTE — Telephone Encounter (Signed)
Sherryl Barters tested positive with a at home test this morning and wants to be put on the list for antibodies or what ever Dr Lorin Picket thinks is best her call back number is  [phone]

## 2020-06-28 NOTE — Progress Notes (Signed)
   Subjective:    Patient ID: Donald Prose, female    DOB: April 28, 1971, 50 y.o.   MRN: [id number]  HPI  Patient presents today with respiratory illness Number of days present- couple days  Symptoms include-cold chills, sore throat, headache  Presence of worrisome signs (severe shortness of breath, lethargy, etc.) -lethargy   Recent/current visit to urgent care or ER-none  Recent direct exposure to Covid-unknown  Any current Covid testing-home test done today and was positive Patient relates no wheezing or shortness of breath but she does have a history of smoking as well as diabetes and obesity  Review of Systems Please see above    Objective:   Physical Exam Lungs clear respiratory rate normal heart regular O2 saturation 99% HEENT benign  Patient did have Covid 1 year ago suffered with shortness of breath but did not get hospitalized     Assessment & Plan:  Positive home Covid test Referral for infusion Would benefit from antiviral If unable to get the infusion input from the infusion center by late tomorrow afternoon I would recommend the patient get the antiviral prescription was printed for her She will hold off on getting the antiviral until she waits to hear from the infusion center by late tomorrow afternoon her 5 days would be up at the end of Sunday so she either needs to do the infusion already antivirals by that time she is at moderately increased risk of having complications because of multiple issues being unvaccinated diabetic and overweight

## 2020-06-29 ENCOUNTER — Telehealth: Payer: Self-pay | Admitting: Infectious Diseases

## 2020-06-29 ENCOUNTER — Other Ambulatory Visit: Payer: Self-pay | Admitting: Infectious Diseases

## 2020-06-29 ENCOUNTER — Ambulatory Visit (HOSPITAL_COMMUNITY)
Admission: RE | Admit: 2020-06-29 | Discharge: 2020-06-29 | Disposition: A | Discharge status: Home / Self Care | Payer: BC Managed Care – PPO | Source: Ambulatory Visit | Attending: Pulmonary Disease | Admitting: Pulmonary Disease

## 2020-06-29 DIAGNOSIS — Z6825 Body mass index (BMI) 25.0-25.9, adult: Secondary | ICD-10-CM | POA: Diagnosis not present

## 2020-06-29 DIAGNOSIS — E119 Type 2 diabetes mellitus without complications: Secondary | ICD-10-CM | POA: Diagnosis not present

## 2020-06-29 DIAGNOSIS — U071 COVID-19: Secondary | ICD-10-CM | POA: Insufficient documentation

## 2020-06-29 MED ORDER — FAMOTIDINE IN NACL 20-0.9 MG/50ML-% IV SOLN: 20.0000 mg | Freq: Once | INTRAVENOUS | Status: DC | PRN

## 2020-06-29 MED ORDER — SODIUM CHLORIDE 0.9 % IV SOLN: INTRAVENOUS | Status: DC | PRN

## 2020-06-29 MED ORDER — DIPHENHYDRAMINE HCL 50 MG/ML IJ SOLN: 50.0000 mg | Freq: Once | INTRAMUSCULAR | Status: DC | PRN

## 2020-06-29 MED ORDER — SOTROVIMAB 500 MG/8ML IV SOLN
500.0000 mg | Freq: Once | INTRAVENOUS | Status: AC
  Administered 2020-06-29: 500 mg via INTRAVENOUS

## 2020-06-29 MED ORDER — METHYLPREDNISOLONE SODIUM SUCC 125 MG IJ SOLR: 125.0000 mg | Freq: Once | INTRAMUSCULAR | Status: DC | PRN

## 2020-06-29 MED ORDER — ALBUTEROL SULFATE HFA 108 (90 BASE) MCG/ACT IN AERS: 2.0000 | INHALATION_SPRAY | Freq: Once | RESPIRATORY_TRACT | Status: DC | PRN

## 2020-06-29 MED ORDER — EPINEPHRINE 0.3 MG/0.3ML IJ SOAJ: 0.3000 mg | Freq: Once | INTRAMUSCULAR | Status: DC | PRN

## 2020-06-29 NOTE — Telephone Encounter (Signed)
Called to discuss with patient about COVID-19 symptoms and the use of one of the available treatments for those with mild to moderate Covid symptoms and at a high risk of hospitalization.  Pt appears to qualify for outpatient treatment due to co-morbid conditions and/or a member of an at-risk group in accordance with the FDA Emergency Use Authorization.    Symptom onset: 2/2 Vaccinated: no Booster? no Immunocompromised?  Qualifiers: DM, depression   Discussed available options - she had MAB in 06/2019 and feels most comfortable with that over the oral antiviral available now. Will set her up for infusion apt this afternoon. Advised she does not need to pick up molnupiravir rx.    Rexene Alberts, NP

## 2020-06-29 NOTE — Progress Notes (Signed)
I connected by phone with Joanne Little on 06/29/2020 at 10:51 AM to discuss the potential use of a new treatment for mild to moderate COVID-19 viral infection in non-hospitalized patients.  This patient is a 50 y.o. female that meets the FDA criteria for Emergency Use Authorization of COVID monoclonal antibody sotrovimab.  Has a (+) direct SARS-CoV-2 viral test result  Has mild or moderate COVID-19   Is NOT hospitalized due to COVID-19  Is within 10 days of symptom onset  Has at least one of the high risk factor(s) for progression to severe COVID-19 and/or hospitalization as defined in EUA.  Specific high risk criteria : BMI > 25, Diabetes, Cardiovascular disease or hypertension and Other high risk medical condition per CDC:  unvaccianted   I have spoken and communicated the following to the patient or parent/caregiver regarding COVID monoclonal antibody treatment:  1. FDA has authorized the emergency use for the treatment of mild to moderate COVID-19 in adults and pediatric patients with positive results of direct SARS-CoV-2 viral testing who are 26 years of age and older weighing at least 40 kg, and who are at high risk for progressing to severe COVID-19 and/or hospitalization.  2. The significant known and potential risks and benefits of COVID monoclonal antibody, and the extent to which such potential risks and benefits are unknown.  3. Information on available alternative treatments and the risks and benefits of those alternatives, including clinical trials.  4. Patients treated with COVID monoclonal antibody should continue to self-isolate and use infection control measures (e.g., wear mask, isolate, social distance, avoid sharing personal items, clean and disinfect "high touch" surfaces, and frequent handwashing) according to CDC guidelines.   5. The patient or parent/caregiver has the option to accept or refuse COVID monoclonal antibody treatment.  After reviewing this information  with the patient, the patient has agreed to receive one of the available covid 19 monoclonal antibodies and will be provided an appropriate fact sheet prior to infusion. Rexene Alberts, NP 06/29/2020 10:51 AM

## 2020-06-29 NOTE — Progress Notes (Signed)
Patient reviewed Fact Sheet for Patients, Parents, and Caregivers for Emergency Use Authorization (EUA) of sotrovimab for the Treatment of Coronavirus. Patient also reviewed and is agreeable to the estimated cost of treatment. Patient is agreeable to proceed.   

## 2020-06-29 NOTE — Discharge Instructions (Signed)

## 2020-06-29 NOTE — Progress Notes (Signed)
Diagnosis: COVID-19  Physician: Dr. Patrick Wright  Procedure: [facility] Infusion [facility] Med: Sotrovimab infusion - Provided patient with sotrovimab fact sheet for patients, parents, and caregivers prior to infusion.   Complications: No immediate complications noted  Discharge: Discharged home    

## 2020-07-02 ENCOUNTER — Ambulatory Visit: Payer: BC Managed Care – PPO | Admitting: Family Medicine

## 2020-07-03 ENCOUNTER — Encounter: Payer: Self-pay | Admitting: Family Medicine

## 2020-07-23 LAB — LIPID PANEL
Chol/HDL Ratio: 6.2 ratio — ABNORMAL HIGH (ref 0.0–4.4)
Cholesterol, Total: 253 mg/dL — ABNORMAL HIGH (ref 100–199)
HDL: 41 mg/dL (ref >39)
LDL Chol Calc (NIH): 185 mg/dL — ABNORMAL HIGH (ref 0–99)
Triglycerides: 145 mg/dL (ref 0–149)
VLDL Cholesterol Cal: 27 mg/dL (ref 5–40)

## 2020-07-23 LAB — BASIC METABOLIC PANEL
BUN/Creatinine Ratio: 15 (ref 9–23)
BUN: 13 mg/dL (ref 6–24)
CO2: 20 mmol/L (ref 20–29)
Calcium: 9.3 mg/dL (ref 8.7–10.2)
Chloride: 102 mmol/L (ref 96–106)
Creatinine, Ser: 0.87 mg/dL (ref 0.57–1.00)
GFR calc Af Amer: 89 mL/min/{1.73_m2} (ref >59)
GFR calc non Af Amer: 77 mL/min/{1.73_m2} (ref >59)
Glucose: 108 mg/dL — ABNORMAL HIGH (ref 65–99)
Potassium: 4.5 mmol/L (ref 3.5–5.2)
Sodium: 138 mmol/L (ref 134–144)

## 2020-07-23 LAB — HEPATIC FUNCTION PANEL
ALT: 37 IU/L — ABNORMAL HIGH (ref 0–32)
AST: 30 IU/L (ref 0–40)
Albumin: 4.5 g/dL (ref 3.8–4.9)
Alkaline Phosphatase: 66 IU/L (ref 44–121)
Bilirubin Total: 0.5 mg/dL (ref 0.0–1.2)
Bilirubin, Direct: 0.13 mg/dL (ref 0.00–0.40)
Total Protein: 7.2 g/dL (ref 6.0–8.5)

## 2020-07-23 LAB — HEPATITIS B SURFACE ANTIGEN: Hepatitis B Surface Ag: NEGATIVE

## 2020-07-23 LAB — HEPATITIS B SURFACE ANTIBODY,QUALITATIVE: Hep B Surface Ab, Qual: NONREACTIVE

## 2020-07-23 LAB — IRON AND TIBC
Iron Saturation: 26 % (ref 15–55)
Iron: 84 ug/dL (ref 27–159)
Total Iron Binding Capacity: 318 ug/dL (ref 250–450)
UIBC: 234 ug/dL (ref 131–425)

## 2020-07-23 LAB — HEPATITIS B CORE ANTIBODY, TOTAL: Hep B Core Total Ab: NEGATIVE

## 2020-07-23 LAB — ANA: ANA Titer 1: NEGATIVE

## 2020-07-23 LAB — CERULOPLASMIN: Ceruloplasmin: 26.5 mg/dL (ref 19.0–39.0)

## 2020-07-23 LAB — FERRITIN: Ferritin: 245 ng/mL — ABNORMAL HIGH (ref 15–150)

## 2020-07-23 LAB — HEPATITIS A ANTIBODY, TOTAL: hep A Total Ab: NEGATIVE

## 2020-07-23 LAB — HEPATITIS C ANTIBODY: Hep C Virus Ab: <0.1 s/co ratio (ref 0.0–0.9)

## 2020-07-23 LAB — HEMOGLOBIN A1C
Est. average glucose Bld gHb Est-mCnc: 103 mg/dL
Hgb A1c MFr Bld: 5.2 % (ref 4.8–5.6)

## 2020-07-23 LAB — ANTI-SMOOTH MUSCLE AB BY IFA: Anti-Smooth Muscle Ab by IFA: <1:20 {titer}

## 2020-07-25 ENCOUNTER — Telehealth (INDEPENDENT_AMBULATORY_CARE_PROVIDER_SITE_OTHER): Payer: BC Managed Care – PPO | Admitting: Family Medicine

## 2020-07-25 ENCOUNTER — Telehealth: Payer: Self-pay | Admitting: Family Medicine

## 2020-07-25 ENCOUNTER — Other Ambulatory Visit: Payer: Self-pay

## 2020-07-25 VITALS — Wt 242.0 lb

## 2020-07-25 DIAGNOSIS — E1169 Type 2 diabetes mellitus with other specified complication: Secondary | ICD-10-CM | POA: Diagnosis not present

## 2020-07-25 DIAGNOSIS — R748 Abnormal levels of other serum enzymes: Secondary | ICD-10-CM | POA: Diagnosis not present

## 2020-07-25 DIAGNOSIS — Z1211 Encounter for screening for malignant neoplasm of colon: Secondary | ICD-10-CM

## 2020-07-25 DIAGNOSIS — K760 Fatty (change of) liver, not elsewhere classified: Secondary | ICD-10-CM | POA: Diagnosis not present

## 2020-07-25 DIAGNOSIS — E785 Hyperlipidemia, unspecified: Secondary | ICD-10-CM

## 2020-07-25 MED ORDER — ROSUVASTATIN CALCIUM 10 MG PO TABS: 10.0000 mg | ORAL_TABLET | Freq: Every day | ORAL | 1 refills | Status: DC

## 2020-07-25 MED ORDER — METFORMIN HCL 500 MG PO TABS: 500.0000 mg | ORAL_TABLET | Freq: Every day | ORAL | 1 refills | Status: DC

## 2020-07-25 NOTE — Progress Notes (Signed)
Subjective:    Patient ID: Joanne Little, female    DOB: 23-May-1971, 50 y.o.   MRN: [id number]  HPI Pt needing follow up on blood work. Pt had lab work done on 07/18/20. Needing to follow up on liver enzymes.  Patient is doing much better job of eating healthy She has lost substantial amount of weight She is down to 142 pounds. She is intending on meeting with a nutritionist in the near future Should be noted that her dad died of cirrhosis at age 88.  Apparently had some alcohol intake earlier in life but 8 years before his death did not drink any alcohol. Patient has been doing very well with sugars and starches Recent lab work reviewed with the patient Results for orders placed or performed in visit on 05/01/20  Hepatic function panel  Result Value Ref Range   Total Protein 7.2 6.0 - 8.5 g/dL   Albumin 4.5 3.8 - 4.9 g/dL   Bilirubin Total 0.5 0.0 - 1.2 mg/dL   Bilirubin, Direct 3.81 0.00 - 0.40 mg/dL   Alkaline Phosphatase 66 44 - 121 IU/L   AST 30 0 - 40 IU/L   ALT 37 (H) 0 - 32 IU/L  Basic metabolic panel  Result Value Ref Range   Glucose 108 (H) 65 - 99 mg/dL   BUN 13 6 - 24 mg/dL   Creatinine, Ser 8.29 0.57 - 1.00 mg/dL   GFR calc non Af Amer 77 >59 mL/min/1.73   GFR calc Af Amer 89 >59 mL/min/1.73   BUN/Creatinine Ratio 15 9 - 23   Sodium 138 134 - 144 mmol/L   Potassium 4.5 3.5 - 5.2 mmol/L   Chloride 102 96 - 106 mmol/L   CO2 20 20 - 29 mmol/L   Calcium 9.3 8.7 - 10.2 mg/dL  Lipid panel  Result Value Ref Range   Cholesterol, Total 253 (H) 100 - 199 mg/dL   Triglycerides 937 0 - 149 mg/dL   HDL 41 >16 mg/dL   VLDL Cholesterol Cal 27 5 - 40 mg/dL   LDL Chol Calc (NIH) 967 (H) 0 - 99 mg/dL   Chol/HDL Ratio 6.2 (H) 0.0 - 4.4 ratio  Hemoglobin A1c  Result Value Ref Range   Hgb A1c MFr Bld 5.2 4.8 - 5.6 %   Est. average glucose Bld gHb Est-mCnc 103 mg/dL  ANA  Result Value Ref Range   ANA Titer 1 Negative    Please Note: Comment   Ceruloplasmin  Result Value  Ref Range   Ceruloplasmin 26.5 19.0 - 39.0 mg/dL  Ferritin  Result Value Ref Range   Ferritin 245 (H) 15 - 150 ng/mL  Iron Binding Cap (TIBC)([facility])  Result Value Ref Range   Total Iron Binding Capacity 318 250 - 450 ug/dL   UIBC 893 810 - 175 ug/dL   Iron 84 27 - 102 ug/dL   Iron Saturation 26 15 - 55 %  Hepatitis B Surface AntiGEN  Result Value Ref Range   Hepatitis B Surface Ag Negative Negative  Hepatitis C Antibody  Result Value Ref Range   Hep C Virus Ab <0.1 0.0 - 0.9 s/co ratio  Hepatitis B Core Antibody, total  Result Value Ref Range   Hep B Core Total Ab Negative Negative  Hepatitis B Surface AntiBODY  Result Value Ref Range   Hep B Surface Ab, Qual Non Reactive   Hepatitis A antibody, total  Result Value Ref Range   hep A Total Ab Negative Negative  Anti-Smooth Muscle Ab by IFA  Result Value Ref Range   Anti-Smooth Muscle Ab by IFA <1:20 <1:20    Virtual Visit via Telephone Note  I connected with Joanne Little on 07/25/20 at  8:20 AM EST by telephone and verified that I am speaking with the correct person using two identifiers.  Location: Patient: home Provider: office   I discussed the limitations, risks, security and privacy concerns of performing an evaluation and management service by telephone and the availability of in person appointments. I also discussed with the patient that there may be a patient responsible charge related to this service. The patient expressed understanding and agreed to proceed.   History of Present Illness:    Observations/Objective:   Assessment and Plan:   Follow Up Instructions:    I discussed the assessment and treatment plan with the patient. The patient was provided an opportunity to ask questions and all were answered. The patient agreed with the plan and demonstrated an understanding of the instructions.   The patient was advised to call back or seek an in-person evaluation if the symptoms worsen or if  the condition fails to improve as anticipated.  I provided 20 minutes of non-face-to-face time during this encounter.       Review of Systems Please see above    Objective:   Physical Exam   Today's visit was via telephone Physical exam was not possible for this visit      Assessment & Plan:  .1. Hyperlipidemia associated with type 2 diabetes mellitus (HCC) Continue the Metformin.  LDL cholesterol significantly elevated.  Patient with aortic atherosclerosis as well as some evidence within the coronary arteries go ahead with statin.  Crestor 10 mg daily side effects discussed statistically speaking patient should be able to get along with the medicine she will notify us if any problems recheck labs in 3 months - metFORMIN (GLUCOPHAGE) 500 MG tablet; Take 1 tablet (500 mg total) by mouth daily with breakfast.  Dispense: 90 tablet; Refill: 1 - rosuvastatin (CRESTOR) 10 MG tablet; Take 1 tablet (10 mg total) by mouth daily.  Dispense: 90 tablet; Refill: 1  2. Screening for colon cancer Referral for screening colonoscopy after discussion with patient  3. Elevated liver enzymes Continue healthy eating lab work looks improved  4. Fatty liver Lab work looks improved continue healthy eating. Follow-up in approximately 4 to 6 months sooner problems

## 2020-07-25 NOTE — Telephone Encounter (Signed)
[name], stockinger are scheduled for a virtual visit with your provider today.    Just as we do with appointments in the office, we must obtain your consent to participate.  Your consent will be active for this visit and any virtual visit you may have with one of our providers in the next 365 days.    If you have a MyChart account, I can also send a copy of this consent to you electronically.  All virtual visits are billed to your insurance company just like a traditional visit in the office.  As this is a virtual visit, video technology does not allow for your provider to perform a traditional examination.  This may limit your provider's ability to fully assess your condition.  If your provider identifies any concerns that need to be evaluated in person or the need to arrange testing such as labs, EKG, etc, we will make arrangements to do so.    Although advances in technology are sophisticated, we cannot ensure that it will always work on either your end or our end.  If the connection with a video visit is poor, we may have to switch to a telephone visit.  With either a video or telephone visit, we are not always able to ensure that we have a secure connection.   I need to obtain your verbal consent now.   Are you willing to proceed with your visit today?   Joanne Little has provided verbal consent on 07/25/2020 for a virtual visit (video or telephone).   Marlowe Shores, LPN 4/0/8144  8:18 AM

## 2020-07-25 NOTE — Progress Notes (Signed)
Referral and labs placed in Epic

## 2020-07-26 ENCOUNTER — Encounter (INDEPENDENT_AMBULATORY_CARE_PROVIDER_SITE_OTHER): Payer: Self-pay | Admitting: *Deleted

## 2020-08-05 ENCOUNTER — Encounter: Payer: Self-pay | Admitting: Family Medicine

## 2020-08-05 DIAGNOSIS — I700 Atherosclerosis of aorta: Secondary | ICD-10-CM

## 2020-08-05 HISTORY — DX: Atherosclerosis of aorta: I70.0

## 2020-09-04 ENCOUNTER — Ambulatory Visit
Admission: RE | Admit: 2020-09-04 | Discharge: 2020-09-04 | Disposition: A | Discharge status: Home / Self Care | Payer: BC Managed Care – PPO | Source: Ambulatory Visit | Attending: Emergency Medicine | Admitting: Emergency Medicine

## 2020-09-04 ENCOUNTER — Other Ambulatory Visit: Payer: Self-pay

## 2020-09-04 VITALS — BP 125/81 | HR 92 | Temp 98.3°F | Resp 18

## 2020-09-04 DIAGNOSIS — J019 Acute sinusitis, unspecified: Secondary | ICD-10-CM

## 2020-09-04 DIAGNOSIS — R0981 Nasal congestion: Secondary | ICD-10-CM | POA: Diagnosis not present

## 2020-09-04 MED ORDER — AMOXICILLIN-POT CLAVULANATE 875-125 MG PO TABS: 1.0000 | ORAL_TABLET | Freq: Two times a day (BID) | ORAL | 0 refills | Status: AC

## 2020-09-04 NOTE — ED Provider Notes (Signed)
Joanne Little   [id number] 09/04/20 Arrival Time: 4035   CC: Nasal congestion  SUBJECTIVE: History from: patient.  Joanne Little is a 50 y.o. female who presents with nasal congestion, ear pain, and greenish color sputum x 5 days.  Denies sick exposure to COVID, flu or strep.  Has tried OTC medications without relief.  Denies aggravating factors.  Reports previous symptoms in the past.   Denies fever, chills, sore throat, SOB, wheezing, chest pain, nausea, changes in bowel or bladder habits.    ROS: As per HPI.  All other pertinent ROS negative.     Past Medical History:  Diagnosis Date  . Abnormal Pap smear   . Anxiety   . Aortic atherosclerosis ([place]) 08/05/2020   Seen on CT scan 2021  . Headache(784.0)   . Obesity (BMI 35.0-39.9 without comorbidity) 07/04/2019  . Urinary tract infection    Past Surgical History:  Procedure Laterality Date  . NO PAST SURGERIES     No Known Allergies No current facility-administered medications on file prior to encounter.   Current Outpatient Medications on File Prior to Encounter  Medication Sig Dispense Refill  . blood glucose meter kit and supplies Dispense based on patient and insurance preference. Use to check glucose once daily as directed. (FOR ICD-10 E11.9). 1 each 0  . citalopram (CELEXA) 20 MG tablet Take 20 mg by mouth every evening.     Marland Kitchen ibuprofen (ADVIL) 200 MG tablet Take 400 mg by mouth every 6 (six) hours as needed for mild pain or moderate pain.    . Lancets (ONETOUCH DELICA PLUS CYELYH90B) MISC USE TO TEST BLOOD SUGAR DAILY 100 each 0  . metFORMIN (GLUCOPHAGE) 500 MG tablet Take 1 tablet (500 mg total) by mouth daily with breakfast. 90 tablet 1  . ONETOUCH ULTRA test strip USE TO TEST BLOOD SUGAR DAILY 100 strip 0  . rosuvastatin (CRESTOR) 10 MG tablet Take 1 tablet (10 mg total) by mouth daily. 90 tablet 1   Social History   Socioeconomic History  . Marital status: Married    Spouse name: Not on file  . Number  of children: Not on file  . Years of education: Not on file  . Highest education level: Not on file  Occupational History  . Not on file  Tobacco Use  . Smoking status: Former Smoker    Packs/day: 0.25    Years: 26.00    Pack years: 6.50    Types: Cigarettes    Start date: 03/28/1985    Quit date: 06/30/2019    Years since quitting: 1.1  . Smokeless tobacco: Never Used  Substance and Sexual Activity  . Alcohol use: No  . Drug use: No  . Sexual activity: Not Currently  Other Topics Concern  . Not on file  Social History Narrative  . Not on file   Social Determinants of Health   Financial Resource Strain: Not on file  Food Insecurity: Not on file  Transportation Needs: Not on file  Physical Activity: Not on file  Stress: Not on file  Social Connections: Not on file  Intimate Partner Violence: Not on file   Family History  Problem Relation Age of Onset  . COPD Mother   . High Cholesterol Mother   . High blood pressure Mother     OBJECTIVE:  Vitals:   09/04/20 1051  BP: 125/81  Pulse: 92  Resp: 18  Temp: 98.3 F (36.8 C)  TempSrc: Oral  SpO2: 95%  General appearance: alert; appears fatigued, but nontoxic; speaking in full sentences and tolerating own secretions HEENT: NCAT; Ears: EACs clear, TMs pearly gray; Eyes: PERRL.  EOM grossly intact. Sinuses: nontender; Nose: nares patent with rhinorrhea, Throat: oropharynx clear, tonsils non erythematous or enlarged, uvula midline  Neck: supple without LAD Lungs: unlabored respirations, symmetrical air entry; cough: mild; no respiratory distress; CTAB Heart: regular rate and rhythm.   Skin: warm and dry Psychological: alert and cooperative; normal mood and affect  LABS:  No results found for this or any previous visit (from the past 24 hour(s)).   ASSESSMENT & PLAN:  1. Sinus congestion   2. Acute non-recurrent sinusitis, unspecified location     Meds ordered this encounter  Medications  .  amoxicillin-clavulanate (AUGMENTIN) 875-125 MG tablet    Sig: Take 1 tablet by mouth every 12 (twelve) hours for 10 days.    Dispense:  20 tablet    Refill:  0    Order Specific Question:   Supervising Provider    Answer:   Raylene Everts [[id number]]   Get plenty of rest and push fluids Augmentin prescribed.  Take as directed and to completion Use OTC zyrtec for nasal congestion, runny nose, and/or sore throat Use OTC flonase for nasal congestion and runny nose Use medications daily for symptom relief Use OTC medications like ibuprofen or tylenol as needed fever or pain Call or go to the ED if you have any new or worsening symptoms such as fever, worsening cough, shortness of breath, chest tightness, chest pain, turning blue, changes in mental status, etc...   Reviewed expectations re: course of current medical issues. Questions answered. Outlined signs and symptoms indicating need for more acute intervention. Patient verbalized understanding. After Visit Summary given.         Lestine Box, PA-C 09/04/20 1127

## 2020-09-04 NOTE — Discharge Instructions (Signed)
Get plenty of rest and push fluids Augmentin prescribed.  Take as directed and to completion Use OTC zyrtec for nasal congestion, runny nose, and/or sore throat Use OTC flonase for nasal congestion and runny nose Use medications daily for symptom relief Use OTC medications like ibuprofen or tylenol as needed fever or pain Call or go to the ED if you have any new or worsening symptoms such as fever, worsening cough, shortness of breath, chest tightness, chest pain, turning blue, changes in mental status, etc...  

## 2020-09-04 NOTE — ED Triage Notes (Signed)
Nasal congestion, ear pain, greenish color sputum.  Symptoms since Thursday.

## 2020-09-14 ENCOUNTER — Other Ambulatory Visit: Payer: Self-pay | Admitting: Family Medicine

## 2020-11-26 ENCOUNTER — Other Ambulatory Visit: Payer: Self-pay | Admitting: Family Medicine

## 2020-11-26 DIAGNOSIS — E1169 Type 2 diabetes mellitus with other specified complication: Secondary | ICD-10-CM

## 2020-11-26 DIAGNOSIS — E785 Hyperlipidemia, unspecified: Secondary | ICD-10-CM

## 2020-12-22 ENCOUNTER — Other Ambulatory Visit: Payer: Self-pay | Admitting: Family Medicine

## 2021-02-24 ENCOUNTER — Other Ambulatory Visit: Payer: Self-pay | Admitting: Family Medicine

## 2021-02-24 DIAGNOSIS — E785 Hyperlipidemia, unspecified: Secondary | ICD-10-CM

## 2021-02-24 DIAGNOSIS — E1169 Type 2 diabetes mellitus with other specified complication: Secondary | ICD-10-CM

## 2021-03-06 ENCOUNTER — Other Ambulatory Visit: Payer: Self-pay | Admitting: Family Medicine

## 2021-03-22 ENCOUNTER — Ambulatory Visit: Payer: Managed Care, Other (non HMO)

## 2021-03-22 ENCOUNTER — Ambulatory Visit
Admission: EM | Admit: 2021-03-22 | Discharge: 2021-03-22 | Disposition: A | Discharge status: Home / Self Care | Payer: Managed Care, Other (non HMO) | Attending: Urgent Care | Admitting: Urgent Care

## 2021-03-22 ENCOUNTER — Encounter: Payer: Self-pay | Admitting: Emergency Medicine

## 2021-03-22 ENCOUNTER — Ambulatory Visit (INDEPENDENT_AMBULATORY_CARE_PROVIDER_SITE_OTHER): Payer: Managed Care, Other (non HMO)

## 2021-03-22 DIAGNOSIS — M25462 Effusion, left knee: Secondary | ICD-10-CM

## 2021-03-22 DIAGNOSIS — M25562 Pain in left knee: Secondary | ICD-10-CM

## 2021-03-22 DIAGNOSIS — E119 Type 2 diabetes mellitus without complications: Secondary | ICD-10-CM

## 2021-03-22 MED ORDER — NAPROXEN 500 MG PO TABS: 500.0000 mg | ORAL_TABLET | Freq: Two times a day (BID) | ORAL | 0 refills | Status: DC

## 2021-03-22 NOTE — ED Provider Notes (Signed)
[facility]   MRN: [id number] DOB: 1970-09-09  Subjective:   Joanne Little is a 50 y.o. female presenting for acute onset this morning of mild to moderate left knee pain.  No fall, trauma.  Patient just woke up with the pain.  Has had difficulty bending.  No history of gout, knee problems, knee surgery.  No history of kidney disease or heart disease.  No current facility-administered medications for this encounter.  Current Outpatient Medications:    blood glucose meter kit and supplies, Dispense based on patient and insurance preference. Use to check glucose once daily as directed. (FOR ICD-10 E11.9)., Disp: 1 each, Rfl: 0   citalopram (CELEXA) 20 MG tablet, Take 20 mg by mouth every evening. , Disp: , Rfl:    ibuprofen (ADVIL) 200 MG tablet, Take 400 mg by mouth every 6 (six) hours as needed for mild pain or moderate pain., Disp: , Rfl:    Lancets (ONETOUCH DELICA PLUS [id number]) MISC, USE TO TEST BLOOD SUGAR DAILY, Disp: 100 each, Rfl: 0   metFORMIN (GLUCOPHAGE) 500 MG tablet, TAKE 1 TABLET BY MOUTH EVERY DAY WITH BREAKFAST, Disp: 90 tablet, Rfl: 0   ONETOUCH ULTRA test strip, USE TO TEST BLOOD SUGAR DAILY, Disp: 100 strip, Rfl: 0   rosuvastatin (CRESTOR) 10 MG tablet, TAKE 1 TABLET BY MOUTH EVERY DAY, Disp: 90 tablet, Rfl: 1   No Known Allergies  Past Medical History:  Diagnosis Date   Abnormal Pap smear    Anxiety    Aortic atherosclerosis ([place]) 08/05/2020   Seen on CT scan 2021   Headache(784.0)    Obesity (BMI 35.0-39.9 without comorbidity) 07/04/2019   Urinary tract infection      Past Surgical History:  Procedure Laterality Date   NO PAST SURGERIES      Family History  Problem Relation Age of Onset   COPD Mother    High Cholesterol Mother    High blood pressure Mother     Social History   Tobacco Use   Smoking status: Former    Packs/day: 0.25    Years: 26.00    Pack years: 6.50    Types: Cigarettes    Start date: 03/28/1985    Quit date:  06/30/2019    Years since quitting: 1.7   Smokeless tobacco: Never  Substance Use Topics   Alcohol use: No   Drug use: No    ROS   Objective:   Vitals: BP 140/87   Pulse 89   Temp 98.1 F (36.7 C) (Oral)   Resp 18   LMP 03/07/2010   SpO2 98%   Physical Exam Constitutional:      General: She is not in acute distress.    Appearance: Normal appearance. She is well-developed. She is not ill-appearing, toxic-appearing or diaphoretic.  HENT:     Head: Normocephalic and atraumatic.     Nose: Nose normal.     Mouth/Throat:     Mouth: Mucous membranes are moist.     Pharynx: Oropharynx is clear.  Eyes:     General: No scleral icterus.       Right eye: No discharge.        Left eye: No discharge.     Extraocular Movements: Extraocular movements intact.     Conjunctiva/sclera: Conjunctivae normal.     Pupils: Pupils are equal, round, and reactive to light.  Cardiovascular:     Rate and Rhythm: Normal rate.  Pulmonary:     Effort: Pulmonary effort is normal.  Skin:    General: Skin is warm and dry.  Neurological:     General: No focal deficit present.     Mental Status: She is alert and oriented to person, place, and time.     Motor: No weakness.     Coordination: Coordination normal.     Gait: Gait normal.     Deep Tendon Reflexes: Reflexes normal.  Psychiatric:        Mood and Affect: Mood normal.        Behavior: Behavior normal.        Thought Content: Thought content normal.        Judgment: Judgment normal.    DG Knee Complete 4 Views Left  Result Date: 03/22/2021 CLINICAL DATA:  Left knee pain, no known injury EXAM: LEFT KNEE - COMPLETE 4+ VIEW COMPARISON:  None. FINDINGS: No fracture or dislocation of the left knee. Joint spaces are preserved. Small, nonspecific knee joint effusion. Soft tissues are unremarkable. IMPRESSION: 1. No fracture or dislocation of the left knee. Joint spaces are preserved. 2.  Small, nonspecific knee joint effusion. Electronically  Signed   By: Joanne Little M.D.   On: 03/22/2021 14:04     Assessment and Plan :   PDMP not reviewed this encounter.  1. Effusion of left knee   2. Acute pain of left knee   3. Type 2 diabetes mellitus treated without insulin (HCC)    A 4 inch Ace wrap was applied to the left knee.  Recommended conservative management including RICE method, naproxen.  Follow-up with Ortho for recheck and consideration of further imaging such as an MRI or ultrasound. Counseled patient on potential for adverse effects with medications prescribed/recommended today, ER and return-to-clinic precautions discussed, patient verbalized understanding.    Jaynee Eagles, PA-C 03/22/21 1416

## 2021-03-22 NOTE — ED Triage Notes (Addendum)
Pt is present today with left knee pain and swelling that started this morning.Pt denies any injury

## 2021-05-31 ENCOUNTER — Encounter: Payer: Self-pay | Admitting: Family Medicine

## 2021-05-31 ENCOUNTER — Other Ambulatory Visit: Payer: Self-pay

## 2021-05-31 ENCOUNTER — Telehealth: Payer: Self-pay | Admitting: *Deleted

## 2021-05-31 ENCOUNTER — Ambulatory Visit (INDEPENDENT_AMBULATORY_CARE_PROVIDER_SITE_OTHER): Payer: Managed Care, Other (non HMO) | Admitting: Family Medicine

## 2021-05-31 DIAGNOSIS — U071 COVID-19: Secondary | ICD-10-CM | POA: Diagnosis not present

## 2021-05-31 MED ORDER — NIRMATRELVIR/RITONAVIR (PAXLOVID)TABLET: 3.0000 | ORAL_TABLET | Freq: Two times a day (BID) | ORAL | 0 refills | Status: AC

## 2021-05-31 NOTE — Progress Notes (Signed)
° °  Subjective:    Patient ID: Joanne Little, female    DOB: 04-30-1971, 51 y.o.   MRN: [id number]  HPI Patient states she was exposed on Tuesday. She did a Covid test yesterday, which was negative. Patient tested again this morning and it was positive. Since yesterday, patient has been having cough, congestion, headache, sore throat and fever.   Review of Systems Virtual Visit via Telephone Note  I connected with Joanne Little on 05/31/21 at  1:30 PM EST by telephone and verified that I am speaking with the correct person using two identifiers.  Location: Patient: home Provider: office   I discussed the limitations, risks, security and privacy concerns of performing an evaluation and management service by telephone and the availability of in person appointments. I also discussed with the patient that there may be a patient responsible charge related to this service. The patient expressed understanding and agreed to proceed.   History of Present Illness:    Observations/Objective:   Assessment and Plan:   Follow Up Instructions:    I discussed the assessment and treatment plan with the patient. The patient was provided an opportunity to ask questions and all were answered. The patient agreed with the plan and demonstrated an understanding of the instructions.   The patient was advised to call back or seek an in-person evaluation if the symptoms worsen or if the condition fails to improve as anticipated.  I provided  15 minutes of non-face-to-face time during this encounter.       Objective:   Physical Exam   Today's visit was via telephone Physical exam was not possible for this visit      Assessment & Plan:   Covid infection This is a viral process.  Mild cases are treated with supportive measures at home such as Tylenol rest fluids.  In some situations monoclonal antibodies may be appropriate depending on the patient's risk criteria.  The patient was educated  regarding progressive illness including respiratory, persistent vomiting, change in mental status.  If any of these occur ER evaluation is recommended. Patient was educated about the following as well Covid-19 respiratory warning: Covid-19 is a virus that causes hypoxia (low oxygen level in blood) in some people. If you develop any changes in your usual breathing pattern: difficulty catching your breath, more short winded with activity or with resting, or anything that concerns you about your breathing, do not hesitate to go to the emergency department immediately for evaluation. Please do not delay to get treatment.   Agrees with plan of care discussed today. Understands warning signs to seek further care: Chest pain, shortness of breath, mental confusion, profuse vomiting, any significant change in health. Understands to follow-up if symptoms do not improve, or worsen.

## 2021-05-31 NOTE — Telephone Encounter (Signed)
Ms. hunter, pinkard are scheduled for a virtual visit with your provider today.    Just as we do with appointments in the office, we must obtain your consent to participate.  Your consent will be active for this visit and any virtual visit you may have with one of our providers in the next 365 days.    If you have a MyChart account, I can also send a copy of this consent to you electronically.  All virtual visits are billed to your insurance company just like a traditional visit in the office.  As this is a virtual visit, video technology does not allow for your provider to perform a traditional examination.  This may limit your provider's ability to fully assess your condition.  If your provider identifies any concerns that need to be evaluated in person or the need to arrange testing such as labs, EKG, etc, we will make arrangements to do so.    Although advances in technology are sophisticated, we cannot ensure that it will always work on either your end or our end.  If the connection with a video visit is poor, we may have to switch to a telephone visit.  With either a video or telephone visit, we are not always able to ensure that we have a secure connection.   I need to obtain your verbal consent now.   Are you willing to proceed with your visit today?   BOBETTE LEYH has provided verbal consent on 05/31/2021 for a virtual visit (video or telephone).   Rocco Serene, LPN 01/26/8181  99:37 AM

## 2021-06-02 ENCOUNTER — Other Ambulatory Visit: Payer: Self-pay | Admitting: Family Medicine

## 2021-06-02 DIAGNOSIS — E1169 Type 2 diabetes mellitus with other specified complication: Secondary | ICD-10-CM

## 2021-06-02 DIAGNOSIS — E785 Hyperlipidemia, unspecified: Secondary | ICD-10-CM

## 2021-06-03 NOTE — Telephone Encounter (Signed)
May have 90-day refill Needs follow-up visit by March

## 2021-06-17 ENCOUNTER — Other Ambulatory Visit: Payer: Self-pay | Admitting: Family Medicine

## 2021-07-14 ENCOUNTER — Telehealth: Payer: Self-pay | Admitting: Family Medicine

## 2021-07-14 DIAGNOSIS — E119 Type 2 diabetes mellitus without complications: Secondary | ICD-10-CM

## 2021-07-14 DIAGNOSIS — R748 Abnormal levels of other serum enzymes: Secondary | ICD-10-CM

## 2021-07-14 DIAGNOSIS — E1169 Type 2 diabetes mellitus with other specified complication: Secondary | ICD-10-CM

## 2021-07-14 DIAGNOSIS — E785 Hyperlipidemia, unspecified: Secondary | ICD-10-CM

## 2021-07-14 NOTE — Telephone Encounter (Signed)
Nurses By March patient is due for a follow-up visit regarding her diabetes We would recommend up-to-date lab work including A1c, lipid liver metabolic 7 urine ACR Please connect with patient have her do her labs and set up follow-up visit thank you

## 2021-07-15 NOTE — Telephone Encounter (Signed)
Blood work ordered in [facility]. Patient notified and stated she will call back to schedule an appointment when she has her schedule.

## 2021-08-18 ENCOUNTER — Other Ambulatory Visit: Payer: Self-pay | Admitting: Family Medicine

## 2021-08-18 DIAGNOSIS — E1169 Type 2 diabetes mellitus with other specified complication: Secondary | ICD-10-CM

## 2021-11-15 ENCOUNTER — Other Ambulatory Visit: Payer: Self-pay | Admitting: Family Medicine

## 2021-11-15 DIAGNOSIS — E1169 Type 2 diabetes mellitus with other specified complication: Secondary | ICD-10-CM

## 2021-11-19 LAB — HM MAMMOGRAPHY

## 2021-12-09 ENCOUNTER — Other Ambulatory Visit: Payer: Self-pay | Admitting: Nurse Practitioner

## 2021-12-09 ENCOUNTER — Ambulatory Visit: Payer: Managed Care, Other (non HMO) | Admitting: Nurse Practitioner

## 2021-12-09 ENCOUNTER — Encounter: Payer: Self-pay | Admitting: Nurse Practitioner

## 2021-12-09 VITALS — BP 122/82 | HR 79 | Temp 97.9°F | Ht 70.0 in | Wt 267.6 lb

## 2021-12-09 DIAGNOSIS — E785 Hyperlipidemia, unspecified: Secondary | ICD-10-CM

## 2021-12-09 DIAGNOSIS — E119 Type 2 diabetes mellitus without complications: Secondary | ICD-10-CM

## 2021-12-09 DIAGNOSIS — E1169 Type 2 diabetes mellitus with other specified complication: Secondary | ICD-10-CM

## 2021-12-09 LAB — POCT GLUCOSE (DEVICE FOR HOME USE)
Glucose Fasting, POC: 142 mg/dL — AB (ref 70–99)
POC Glucose: 142 mg/dl — AB (ref 70–99)

## 2021-12-09 MED ORDER — METFORMIN HCL ER 500 MG PO TB24: 1000.0000 mg | ORAL_TABLET | Freq: Every day | ORAL | 0 refills | Status: DC

## 2021-12-09 MED ORDER — ROSUVASTATIN CALCIUM 10 MG PO TABS: 10.0000 mg | ORAL_TABLET | Freq: Every day | ORAL | 1 refills | Status: DC

## 2021-12-09 NOTE — Progress Notes (Signed)
Subjective:    Patient ID: Joanne Little, female    DOB: Aug 18, 1970, 51 y.o.   MRN: [id number]  HPI  51 year old female patient with history of recent onset diabetes presents to clinic today with complaints of having blood sugars in the 300s.  Patient states that she was not taking her metformin as she was trying to wean herself off but then noticed that her blood sugars were increasing.  Patient has since restarted her metformin 500 mg daily.  Patient also states that she has also experienced increased near vision blurriness.  Patient also admits to feeling more thirsty.  Patient denies any floaters, chest pain, shortness of breath, difficulty breathing, swelling.    Review of Systems  Eyes:        Nearsighted eye blurriness  Endocrine: Positive for polydipsia.  All other systems reviewed and are negative.      Objective:   Physical Exam Vitals reviewed.  Constitutional:      General: She is not in acute distress.    Appearance: Normal appearance. She is obese. She is not ill-appearing, toxic-appearing or diaphoretic.  HENT:     Head: Normocephalic and atraumatic.  Neck:     Vascular: No carotid bruit.  Cardiovascular:     Rate and Rhythm: Normal rate and regular rhythm.     Pulses: Normal pulses.     Heart sounds: Normal heart sounds. No murmur heard. Pulmonary:     Effort: Pulmonary effort is normal. No respiratory distress.     Breath sounds: Normal breath sounds. No wheezing.  Abdominal:     General: Abdomen is flat. Bowel sounds are normal. There is no distension.     Palpations: Abdomen is soft. There is no mass.     Tenderness: There is no abdominal tenderness. There is no guarding or rebound.     Hernia: No hernia is present.  Musculoskeletal:     Cervical back: Normal range of motion and neck supple. No rigidity or tenderness.     Comments: Grossly intact  Lymphadenopathy:     Cervical: No cervical adenopathy.  Skin:    General: Skin is warm.     Capillary  Refill: Capillary refill takes less than 2 seconds.  Neurological:     Mental Status: She is alert.     Comments: Grossly intact  Psychiatric:        Mood and Affect: Mood normal.        Behavior: Behavior normal.        Assessment & Plan:   1. Diabetes mellitus without complication ([place]) -We will increase metformin to 1000 mg a day. -We will also get A1c today.  If elevated may consider other pharmacotherapeutics. - POCT Glucose (Device for Home Use) =142 today fasting - Urine Microalbumin w/creat. ratio - Hemoglobin A1c - CMP14+EGFR - metFORMIN (GLUCOPHAGE-XR) 500 MG 24 hr tablet; Take 2 tablets (1,000 mg total) by mouth daily with breakfast.  Dispense: 60 tablet; Refill: 0 - Ambulatory referral to Ophthalmology -Encourage weight loss, exercising, and well-balanced diet with plenty of vegetables and lean meats. -Return to clinic 4 weeks -If lab work requires action prior to 4 weeks we will communicate with patient via MyChart if possible  2. Hyperlipidemia associated with type 2 diabetes mellitus ([place]) -Continue taking Crestor as prescribed - Lipid panel - rosuvastatin (CRESTOR) 10 MG tablet; Take 1 tablet (10 mg total) by mouth daily.  Dispense: 90 tablet; Refill: 1 -Return to clinic 4 weeks    Note:  This document was prepared using Dragon voice recognition software and may include unintentional dictation errors. Note - This record has been created using [employer].  Chart creation errors have been sought, but may not always  have been located. Such creation errors do not reflect on  the standard of medical care.

## 2021-12-09 NOTE — Telephone Encounter (Signed)
Pharmacy informed of md message. No charge per pharmacy.

## 2021-12-09 NOTE — Patient Instructions (Addendum)
Check out Eager to Motivate (E2M) 

## 2021-12-10 LAB — CMP14+EGFR
ALT: 92 IU/L — ABNORMAL HIGH (ref 0–32)
AST: 55 IU/L — ABNORMAL HIGH (ref 0–40)
Albumin/Globulin Ratio: 2.1 (ref 1.2–2.2)
Albumin: 4.8 g/dL (ref 3.8–4.9)
Alkaline Phosphatase: 76 IU/L (ref 44–121)
BUN/Creatinine Ratio: 15 (ref 9–23)
BUN: 14 mg/dL (ref 6–24)
Bilirubin Total: 0.4 mg/dL (ref 0.0–1.2)
CO2: 21 mmol/L (ref 20–29)
Calcium: 9.5 mg/dL (ref 8.7–10.2)
Chloride: 103 mmol/L (ref 96–106)
Creatinine, Ser: 0.93 mg/dL (ref 0.57–1.00)
Globulin, Total: 2.3 g/dL (ref 1.5–4.5)
Glucose: 142 mg/dL — ABNORMAL HIGH (ref 70–99)
Potassium: 4.6 mmol/L (ref 3.5–5.2)
Sodium: 141 mmol/L (ref 134–144)
Total Protein: 7.1 g/dL (ref 6.0–8.5)
eGFR: 74 mL/min/{1.73_m2} (ref >59)

## 2021-12-10 LAB — MICROALBUMIN / CREATININE URINE RATIO
Creatinine, Urine: 323.8 mg/dL
Microalb/Creat Ratio: 6 mg/g creat (ref 0–29)
Microalbumin, Urine: 20.8 ug/mL

## 2021-12-10 LAB — LIPID PANEL
Chol/HDL Ratio: 6.3 ratio — ABNORMAL HIGH (ref 0.0–4.4)
Cholesterol, Total: 252 mg/dL — ABNORMAL HIGH (ref 100–199)
HDL: 40 mg/dL (ref >39)
LDL Chol Calc (NIH): 181 mg/dL — ABNORMAL HIGH (ref 0–99)
Triglycerides: 167 mg/dL — ABNORMAL HIGH (ref 0–149)
VLDL Cholesterol Cal: 31 mg/dL (ref 5–40)

## 2021-12-10 LAB — HEMOGLOBIN A1C
Est. average glucose Bld gHb Est-mCnc: 140 mg/dL
Hgb A1c MFr Bld: 6.5 % — ABNORMAL HIGH (ref 4.8–5.6)

## 2022-01-05 ENCOUNTER — Other Ambulatory Visit: Payer: Self-pay | Admitting: Nurse Practitioner

## 2022-01-05 DIAGNOSIS — E119 Type 2 diabetes mellitus without complications: Secondary | ICD-10-CM

## 2022-01-06 ENCOUNTER — Encounter: Payer: Self-pay | Admitting: Nurse Practitioner

## 2022-01-06 ENCOUNTER — Ambulatory Visit: Payer: Managed Care, Other (non HMO) | Admitting: Nurse Practitioner

## 2022-01-06 VITALS — BP 112/72 | HR 85 | Temp 97.3°F | Wt 266.4 lb

## 2022-01-06 DIAGNOSIS — E1169 Type 2 diabetes mellitus with other specified complication: Secondary | ICD-10-CM | POA: Diagnosis not present

## 2022-01-06 DIAGNOSIS — E119 Type 2 diabetes mellitus without complications: Secondary | ICD-10-CM | POA: Diagnosis not present

## 2022-01-06 DIAGNOSIS — E785 Hyperlipidemia, unspecified: Secondary | ICD-10-CM

## 2022-01-06 DIAGNOSIS — R748 Abnormal levels of other serum enzymes: Secondary | ICD-10-CM

## 2022-01-06 MED ORDER — METFORMIN HCL ER 500 MG PO TB24: 1000.0000 mg | ORAL_TABLET | Freq: Every day | ORAL | 2 refills | Status: DC

## 2022-01-06 NOTE — Patient Instructions (Signed)
[facility]  [phone]  [facility] [phone]  [facility] [phone]  [facility] [phone]  [place] [phone]

## 2022-01-06 NOTE — Progress Notes (Signed)
   Subjective:    Patient ID: Joanne Little, female    DOB: [birth year], 51 y.o.   MRN: [id number]  HPI  Pt here to follow up on DM. Pt states things are much better. Checking sugars daily. Average fasting blood sugars around 95 and postprandial blood sugars around 150s. Taking Metformin 2 tablets daily without difficulty. Patient states that vision has gotten a lot better. However, she states that no one called her regarding the ophthalmology referral.   Patient also started on Crestor 37m during last visit for an LDL of 181. Patient taking medication without difficulty.  Patient denies any Chest pain, SOB, difficulty breath, swelling of lower extremities.   Patient has no concerns today.    Review of Systems  All other systems reviewed and are negative.      Objective:   Physical Exam Vitals reviewed.  Constitutional:      General: She is not in acute distress.    Appearance: Normal appearance. She is obese. She is not ill-appearing, toxic-appearing or diaphoretic.  HENT:     Head: Normocephalic and atraumatic.  Cardiovascular:     Rate and Rhythm: Normal rate and regular rhythm.     Pulses: Normal pulses.     Heart sounds: Normal heart sounds. No murmur heard. Pulmonary:     Effort: Pulmonary effort is normal. No respiratory distress.     Breath sounds: Normal breath sounds. No wheezing.  Musculoskeletal:     Comments: Grossly intact  Skin:    General: Skin is warm.     Capillary Refill: Capillary refill takes less than 2 seconds.  Neurological:     Mental Status: She is alert.     Comments: Grossly intact  Psychiatric:        Mood and Affect: Mood normal.        Behavior: Behavior normal.       Assessment & Plan:    1. Diabetes mellitus without complication (HCC) -Last A1c was 6.5.  Goal of A1c less than 7 met. - Continue taking Metformin 1013mdaily - metFORMIN (GLUCOPHAGE-XR) 500 MG 24 hr tablet; Take 2 tablets (1,000 mg total) by mouth daily with breakfast.   Dispense: 60 tablet; Refill: 2 - CMP14+EGFR - Lipid Profile - Encouraged patient to have eye check, provided list of optometrist in the area.  -RTC in 2 months, will recheck A1C at that time.   2. Hyperlipidemia associated with type 2 diabetes mellitus (HCC) - Last LDL 181. Diabetic goal LDL of less than 70 not met.  - Continue with Crestor 1026mMay increase crestor to 63m39m LDL not at goal of less than 70 - Lipid Profile - RTC in 2 months  3. Elevated liver enzymes - Liver enzmes elevated during last visit, will recheck CMP. - CMP14+EGFR    Note:  This document was prepared using Dragon voice recognition software and may include unintentional dictation errors. Note - This record has been created using [employer] creation errors have been sought, but may not always  have been located. Such creation errors do not reflect on  the standard of medical care.

## 2022-01-07 LAB — LIPID PANEL
Chol/HDL Ratio: 3.7 ratio (ref 0.0–4.4)
Cholesterol, Total: 137 mg/dL (ref 100–199)
HDL: 37 mg/dL — ABNORMAL LOW (ref >39)
LDL Chol Calc (NIH): 80 mg/dL (ref 0–99)
Triglycerides: 111 mg/dL (ref 0–149)
VLDL Cholesterol Cal: 20 mg/dL (ref 5–40)

## 2022-01-07 LAB — CMP14+EGFR
ALT: 70 IU/L — ABNORMAL HIGH (ref 0–32)
AST: 42 IU/L — ABNORMAL HIGH (ref 0–40)
Albumin/Globulin Ratio: 2.1 (ref 1.2–2.2)
Albumin: 4.6 g/dL (ref 3.8–4.9)
Alkaline Phosphatase: 81 IU/L (ref 44–121)
BUN/Creatinine Ratio: 18 (ref 9–23)
BUN: 15 mg/dL (ref 6–24)
Bilirubin Total: 0.4 mg/dL (ref 0.0–1.2)
CO2: 23 mmol/L (ref 20–29)
Calcium: 9.2 mg/dL (ref 8.7–10.2)
Chloride: 104 mmol/L (ref 96–106)
Creatinine, Ser: 0.83 mg/dL (ref 0.57–1.00)
Globulin, Total: 2.2 g/dL (ref 1.5–4.5)
Glucose: 136 mg/dL — ABNORMAL HIGH (ref 70–99)
Potassium: 4.3 mmol/L (ref 3.5–5.2)
Sodium: 141 mmol/L (ref 134–144)
Total Protein: 6.8 g/dL (ref 6.0–8.5)
eGFR: 85 mL/min/{1.73_m2} (ref >59)

## 2022-01-18 ENCOUNTER — Other Ambulatory Visit: Payer: Self-pay | Admitting: Nurse Practitioner

## 2022-01-18 DIAGNOSIS — E119 Type 2 diabetes mellitus without complications: Secondary | ICD-10-CM

## 2022-02-21 IMAGING — DX DG KNEE COMPLETE 4+V*L*
4 series · 4 of 4 positions shown · non-contrast
Comparison: None.

CLINICAL DATA: Left knee pain, no known injury

EXAM:
LEFT KNEE - COMPLETE 4+ VIEW

[knee ap]
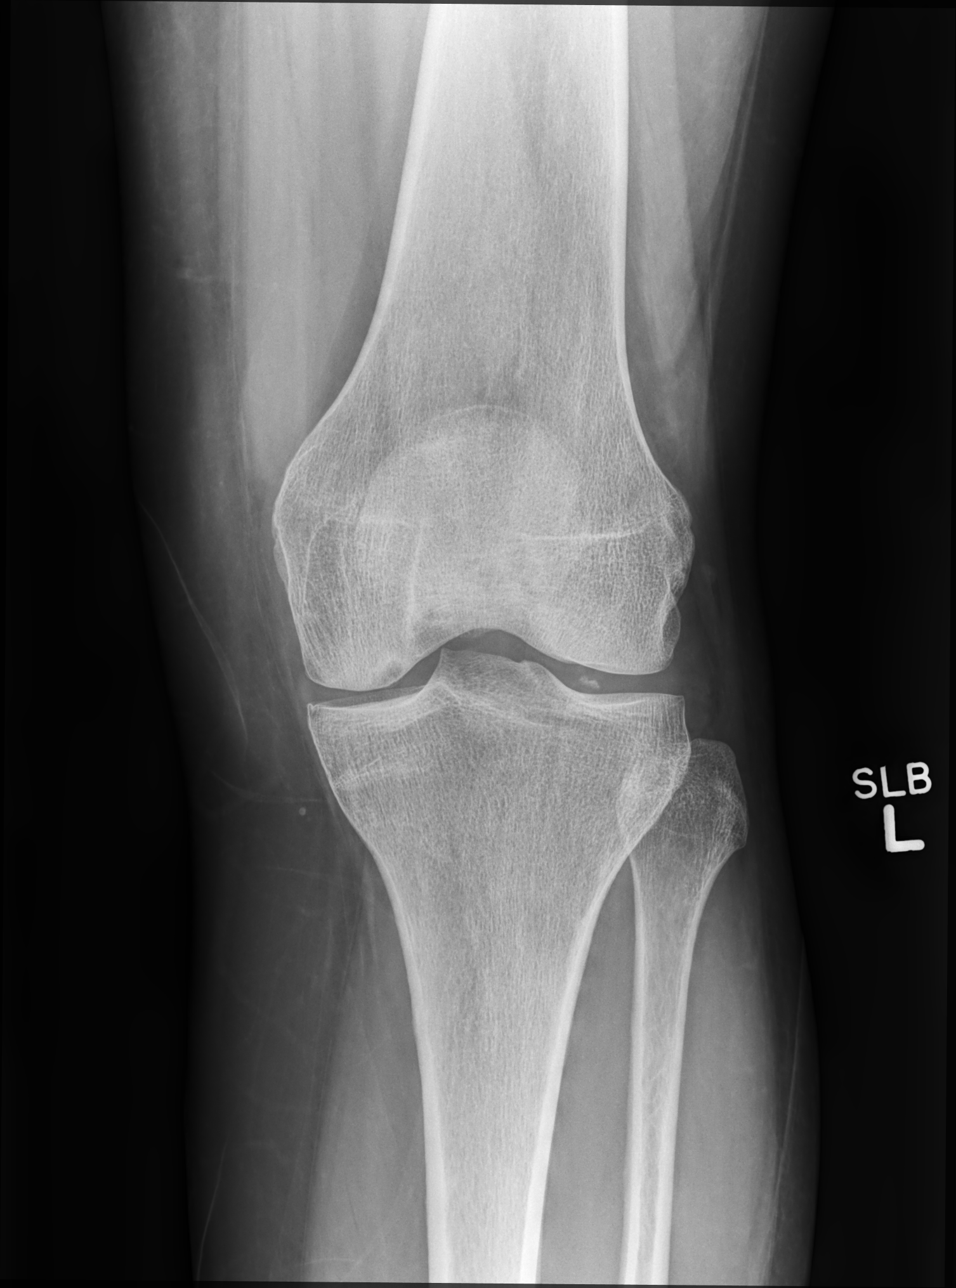

[knee mlo (1 of 2)]
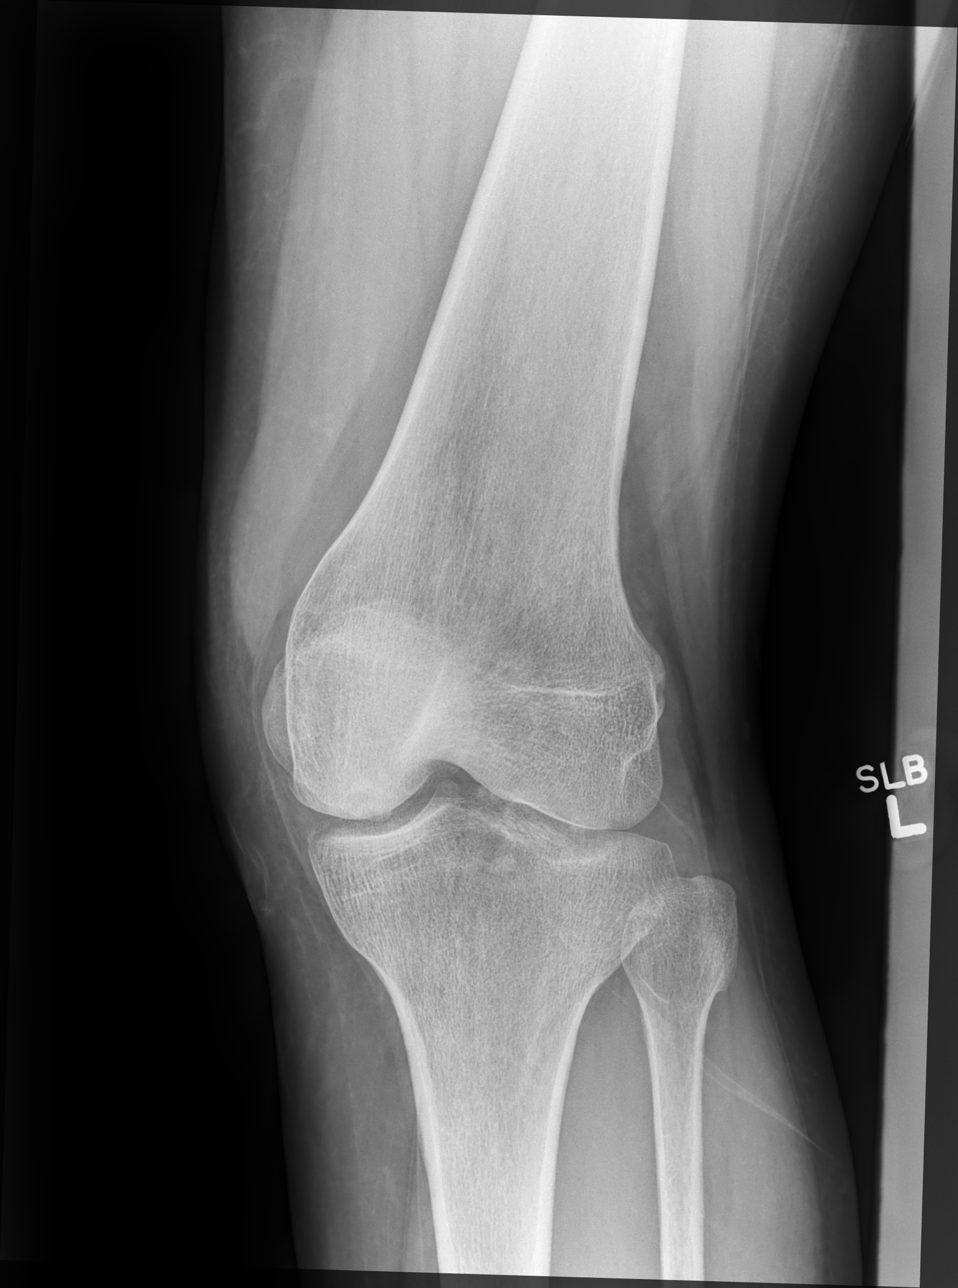

[knee mlo (2 of 2)]
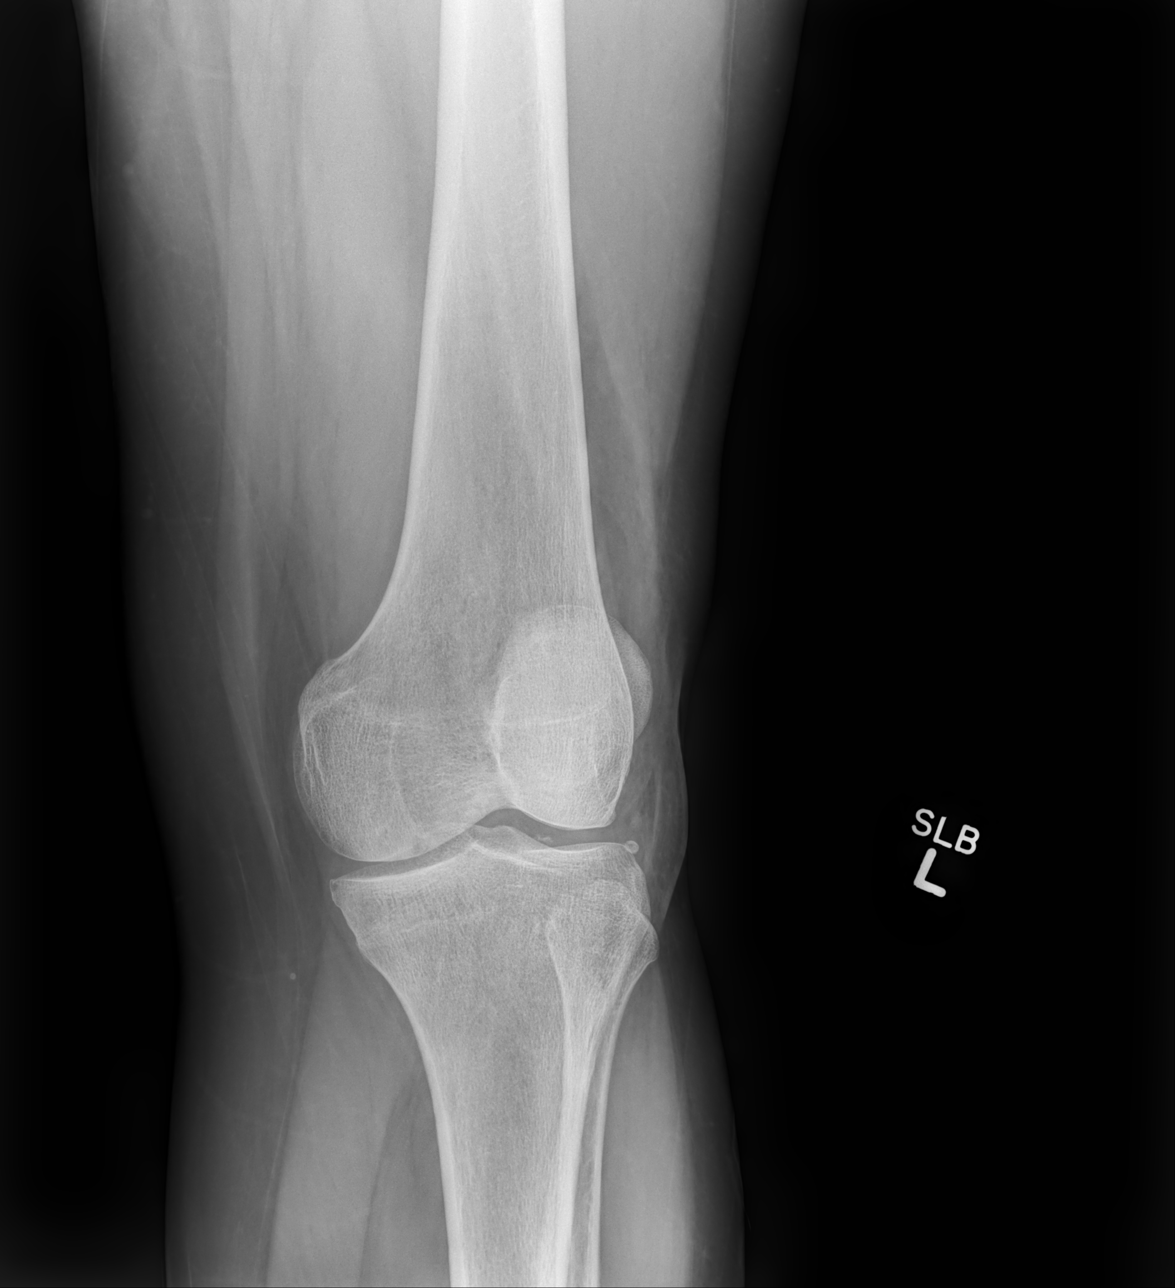

[knee lat]
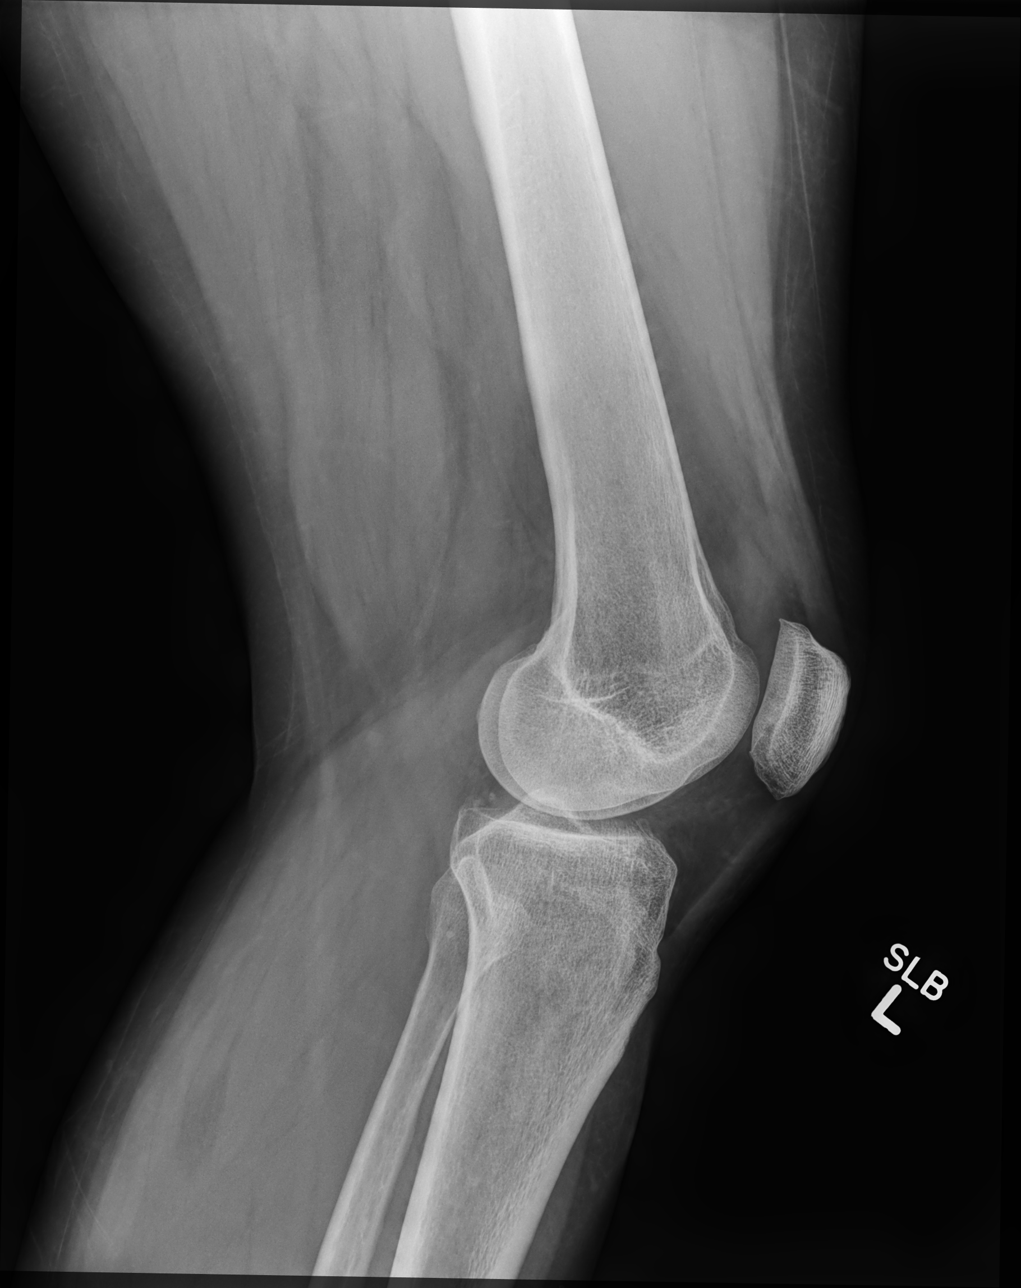

[4 of 4 positions shown; findings below may reference images not displayed]

FINDINGS: No fracture or dislocation of the left knee. Joint spaces are
preserved. Small, nonspecific knee joint effusion. Soft tissues are
unremarkable.
IMPRESSION: 1. No fracture or dislocation of the left knee. Joint spaces are
preserved.

2.  Small, nonspecific knee joint effusion.

## 2022-04-08 ENCOUNTER — Ambulatory Visit: Payer: Managed Care, Other (non HMO) | Admitting: Nurse Practitioner

## 2022-04-08 VITALS — BP 120/72 | HR 68 | Temp 97.6°F | Ht 70.0 in | Wt 263.2 lb

## 2022-04-08 DIAGNOSIS — E1169 Type 2 diabetes mellitus with other specified complication: Secondary | ICD-10-CM | POA: Diagnosis not present

## 2022-04-08 DIAGNOSIS — E119 Type 2 diabetes mellitus without complications: Secondary | ICD-10-CM

## 2022-04-08 DIAGNOSIS — E785 Hyperlipidemia, unspecified: Secondary | ICD-10-CM | POA: Diagnosis not present

## 2022-04-08 NOTE — Progress Notes (Unsigned)
   Subjective:    Patient ID: Joanne Little, female    DOB: April 19, 1971, 51 y.o.   MRN: [id number]  HPI Patient arrives to discuss medications. Patient states she is doing well with her medications and has had no side effects.    Review of Systems     Objective:   Physical Exam        Assessment & Plan:

## 2022-04-09 LAB — CMP14+EGFR
ALT: 61 IU/L — ABNORMAL HIGH (ref 0–32)
AST: 39 IU/L (ref 0–40)
Albumin/Globulin Ratio: 1.9 (ref 1.2–2.2)
Albumin: 4.6 g/dL (ref 3.8–4.9)
Alkaline Phosphatase: 76 IU/L (ref 44–121)
BUN/Creatinine Ratio: 22 (ref 9–23)
BUN: 17 mg/dL (ref 6–24)
Bilirubin Total: 0.4 mg/dL (ref 0.0–1.2)
CO2: 24 mmol/L (ref 20–29)
Calcium: 9.5 mg/dL (ref 8.7–10.2)
Chloride: 100 mmol/L (ref 96–106)
Creatinine, Ser: 0.76 mg/dL (ref 0.57–1.00)
Globulin, Total: 2.4 g/dL (ref 1.5–4.5)
Glucose: 214 mg/dL — ABNORMAL HIGH (ref 70–99)
Potassium: 4.3 mmol/L (ref 3.5–5.2)
Sodium: 139 mmol/L (ref 134–144)
Total Protein: 7 g/dL (ref 6.0–8.5)
eGFR: 94 mL/min/{1.73_m2} (ref >59)

## 2022-04-09 LAB — LIPID PANEL
Chol/HDL Ratio: 3.7 ratio (ref 0.0–4.4)
Cholesterol, Total: 151 mg/dL (ref 100–199)
HDL: 41 mg/dL (ref >39)
LDL Chol Calc (NIH): 82 mg/dL (ref 0–99)
Triglycerides: 161 mg/dL — ABNORMAL HIGH (ref 0–149)
VLDL Cholesterol Cal: 28 mg/dL (ref 5–40)

## 2022-04-09 LAB — HEMOGLOBIN A1C
Est. average glucose Bld gHb Est-mCnc: 143 mg/dL
Hgb A1c MFr Bld: 6.6 % — ABNORMAL HIGH (ref 4.8–5.6)

## 2022-04-10 ENCOUNTER — Encounter: Payer: Self-pay | Admitting: Family Medicine

## 2022-04-10 ENCOUNTER — Encounter: Payer: Self-pay | Admitting: Nurse Practitioner

## 2022-04-14 ENCOUNTER — Other Ambulatory Visit: Payer: Self-pay | Admitting: Family Medicine

## 2022-04-14 NOTE — Telephone Encounter (Signed)
Nurses-please communicate with Nichelle.  If she is on board with [facility] please start off at 2.5 mg once a week, 1 month supply with 2 refills.  She is to give [place] update within 3 weeks how she is doing.  She is to do a follow-up visit in approximately 3 months.  You may share this communication and the following communication with Windell Moulding.  Thanks-Dr. Lorin Picket   hi [facility] you and your family are doing well.  So in this particular situation GLP-1 medicines can be very beneficial for not only getting diabetes under control but also helping with weight reduction.  Greggory Keen is a GLP-1 medicine very similar to Ozempic but actually has better medical study outcomes in regards to not only controlling diabetes but helping to reduce weight  It is started off at the lower dose of 2.5 mg once a week then it would be important for you to give [place] feedback every 3 to 4 weeks how you are doing.  The goal is to keep gradually increasing the dose of the medication every 1 to 2 months most people see significant weight reduction when the dose of the medicine is more in the range of 7.5 mg/week or 10 mg.  Maximum dose is 15 mg.  Typically when we send these medications then we have to go through the process of getting it approved with the insurance company.  This is pretty standard nowadays.  It is very common to have some nausea in the first few weeks of taking this medicine but typically settles down after being on the medicine ongoing.  If you start having significant abdominal pains excessive nausea or frequent vomiting it is recommended to stop the medicine.  Please communicate with [place] any particular issues questions or concerns thanks-Dr. Lorin Picket

## 2022-04-15 ENCOUNTER — Encounter: Payer: Self-pay | Admitting: *Deleted

## 2022-04-15 MED ORDER — TIRZEPATIDE 2.5 MG/0.5ML ~~LOC~~ SOAJ: SUBCUTANEOUS | 2 refills | Status: DC

## 2022-04-15 NOTE — Telephone Encounter (Signed)
Mounjaro 2.5 mg, 1 month supply, 2 refills  Continue metformin Give [place] feedback within 3 weeks how things are going If going well we can increase the dose for the next month Follow-up office visit in approximately 3 months would be advisable Would be wise to go ahead and set up the appointment due to schedule gets busy

## 2022-04-15 NOTE — Telephone Encounter (Signed)
Pt called into office and verbalized understanding

## 2022-04-16 ENCOUNTER — Other Ambulatory Visit: Payer: Self-pay | Admitting: Nurse Practitioner

## 2022-04-16 DIAGNOSIS — E119 Type 2 diabetes mellitus without complications: Secondary | ICD-10-CM

## 2022-04-22 ENCOUNTER — Telehealth: Payer: Self-pay

## 2022-04-22 NOTE — Telephone Encounter (Signed)
Caller name: HUGH GARROW  On DPR?: Yes  Call back number: [phone] (mobile)  Provider they see: Babs Sciara, MD  Reason for call:Pt is calling Kenney Houseman about the tirzepatide [facility]) 2.5 MG/0.5ML Pen pharmacy is telling her to call her insurance and then insurance is telling her to call her doctor and she wants to speak with Kenney Houseman said she has been working with her on this.

## 2022-04-22 NOTE — Telephone Encounter (Signed)
PA approved until 03/2023; pt contacted and verbalized understanding

## 2022-04-22 NOTE — Telephone Encounter (Signed)
Patient needing PA to be completed for [facility]. PA completed and pt is aware. Await determination from insurance

## 2022-05-22 ENCOUNTER — Encounter: Payer: Self-pay | Admitting: Family Medicine

## 2022-05-23 MED ORDER — TIRZEPATIDE 5 MG/0.5ML ~~LOC~~ SOAJ: 5.0000 mg | SUBCUTANEOUS | 1 refills | Status: DC

## 2022-05-23 NOTE — Telephone Encounter (Signed)
Nurses Would recommend Mounjaro 5 mg once a week She is to keep [place] updated on how she is doing Send in 1 month with 1 additional refill I recommend follow-up office visit in February very important for patient to go ahead and schedule this well appointments have good availability Also based on how she does over the next 3 weeks may increase this to the 7.5 Please do the above and communicate this to the patient as well thanks

## 2022-06-13 ENCOUNTER — Other Ambulatory Visit: Payer: Self-pay | Admitting: *Deleted

## 2022-06-13 DIAGNOSIS — E1169 Type 2 diabetes mellitus with other specified complication: Secondary | ICD-10-CM

## 2022-06-13 MED ORDER — ROSUVASTATIN CALCIUM 10 MG PO TABS: 10.0000 mg | ORAL_TABLET | Freq: Every day | ORAL | 0 refills | Status: DC

## 2022-06-19 ENCOUNTER — Telehealth: Payer: Self-pay

## 2022-06-19 NOTE — Telephone Encounter (Signed)
Nurses Please review with Joanne Little make sure she is not having any significant issues If she is having shortness of breath or feeling that she is getting severely ill I would like to see her-her options would be urgent care or here tomorrow late morning  If it is more just headache body aches fever but no shortness of breath issues you may send in Tamiflu 75 mg 1 twice daily for 5 days Most people tolerate Tamiflu well but if it causes severe nausea and vomiting to just stop the Tamiflu Also if progressive clinical symptoms such as shortness of breath difficulty breathing or anything else that is worrisome she would need to be seen

## 2022-06-19 NOTE — Telephone Encounter (Signed)
Pt called in stating that she has tested positive for the flu from her daughter who was seen recently by pcp. Pt would like to know if pcp would send in some Tamiflu to the [facility] [address] Please advise.  Cb#: [phone]

## 2022-06-19 NOTE — Telephone Encounter (Signed)
Mom stated when the daughter was seen she was told if they got symptoms you would call in the tamiflu for them- she has fever cough, little SOB and feels terrible and does not feel like coming in and declined appointment- was just wanting med sent to pharmacy if possible

## 2022-06-23 ENCOUNTER — Telehealth: Payer: Self-pay | Admitting: Family Medicine

## 2022-06-23 ENCOUNTER — Ambulatory Visit: Payer: Managed Care, Other (non HMO) | Admitting: Family Medicine

## 2022-06-23 VITALS — BP 111/78 | HR 80 | Temp 97.9°F | Wt 247.6 lb

## 2022-06-23 DIAGNOSIS — E119 Type 2 diabetes mellitus without complications: Secondary | ICD-10-CM

## 2022-06-23 DIAGNOSIS — E1169 Type 2 diabetes mellitus with other specified complication: Secondary | ICD-10-CM

## 2022-06-23 DIAGNOSIS — J029 Acute pharyngitis, unspecified: Secondary | ICD-10-CM | POA: Diagnosis not present

## 2022-06-23 DIAGNOSIS — R6889 Other general symptoms and signs: Secondary | ICD-10-CM

## 2022-06-23 DIAGNOSIS — R748 Abnormal levels of other serum enzymes: Secondary | ICD-10-CM

## 2022-06-23 NOTE — Telephone Encounter (Signed)
Patient aware she will be seeing Dr Nicki Reaper.

## 2022-06-23 NOTE — Progress Notes (Signed)
  t Subjective:    Patient ID: Joanne Little, female    DOB: May 12, 1971, 52 y.o.   MRN: [id number]  HPI Patient arrives today sore throat, cough, denies fever, headache, no home COVID test. Her daughter had the flu last week then she started getting symptoms with bodyaches headache congestion drainage no wheezing or difficulty breathing but does relate a lot of sinus congestion drainage and severe sore throat the past few days   Review of Systems     Objective:   Physical Exam General-in no acute distress Eyes-no discharge Lungs-respiratory rate normal, CTA CV-no murmurs,RRR Extremities skin warm dry no edema Neuro grossly normal Behavior normal, alert  Sinus nontender throat is normal lungs clear no crackles I find no evidence of pneumonia on today's exam no evidence of strep throat more than likely sore throat due to postnasal drainage Patient does have some level of post flu symptoms I recommend no work for the next couple days    Assessment & Plan:  Viral syndrome More than likely flu Patient will be doing a wellness in the near future We will have her do labs before follow-up Handwritten prescription for antibiotics written in case her illness does evolve into a sinus infection next 7 days she can get it filled Follow-up if any progressive troubles

## 2022-06-23 NOTE — Telephone Encounter (Signed)
Patient states she is going to do a wellness exam later this spring She will be due for A1c, lipid, liver, metabolic 7, urine ACR  She can do these labs close to her wellness thank you

## 2022-06-23 NOTE — Telephone Encounter (Signed)
Please have Joanne Little and her daughter [name] term at 4 PM please move them over to my schedule in open up the 2 slots for Dr. Lacinda Axon

## 2022-06-24 NOTE — Telephone Encounter (Signed)
Lab orders placed and mailed to patient with note to call and schedule wellness

## 2022-07-15 LAB — LIPID PANEL
Chol/HDL Ratio: 2.7 ratio (ref 0.0–4.4)
Cholesterol, Total: 121 mg/dL (ref 100–199)
HDL: 45 mg/dL (ref >39)
LDL Chol Calc (NIH): 57 mg/dL (ref 0–99)
Triglycerides: 103 mg/dL (ref 0–149)
VLDL Cholesterol Cal: 19 mg/dL (ref 5–40)

## 2022-07-15 LAB — HEMOGLOBIN A1C
Est. average glucose Bld gHb Est-mCnc: 108 mg/dL
Hgb A1c MFr Bld: 5.4 % (ref 4.8–5.6)

## 2022-07-15 LAB — HEPATIC FUNCTION PANEL
ALT: 59 IU/L — ABNORMAL HIGH (ref 0–32)
AST: 38 IU/L (ref 0–40)
Albumin: 4.7 g/dL (ref 3.8–4.9)
Alkaline Phosphatase: 63 IU/L (ref 44–121)
Bilirubin Total: 0.5 mg/dL (ref 0.0–1.2)
Bilirubin, Direct: 0.15 mg/dL (ref 0.00–0.40)
Total Protein: 7.1 g/dL (ref 6.0–8.5)

## 2022-07-15 LAB — BASIC METABOLIC PANEL
BUN/Creatinine Ratio: 15 (ref 9–23)
BUN: 12 mg/dL (ref 6–24)
CO2: 22 mmol/L (ref 20–29)
Calcium: 9.6 mg/dL (ref 8.7–10.2)
Chloride: 103 mmol/L (ref 96–106)
Creatinine, Ser: 0.82 mg/dL (ref 0.57–1.00)
Glucose: 86 mg/dL (ref 70–99)
Potassium: 4.5 mmol/L (ref 3.5–5.2)
Sodium: 139 mmol/L (ref 134–144)
eGFR: 85 mL/min/{1.73_m2} (ref >59)

## 2022-07-15 LAB — MICROALBUMIN / CREATININE URINE RATIO
Creatinine, Urine: 38.4 mg/dL
Microalb/Creat Ratio: <8 mg/g creat (ref 0–29)
Microalbumin, Urine: <3 ug/mL

## 2022-07-16 ENCOUNTER — Other Ambulatory Visit: Payer: Self-pay | Admitting: Family Medicine

## 2022-07-16 DIAGNOSIS — E119 Type 2 diabetes mellitus without complications: Secondary | ICD-10-CM

## 2022-07-18 ENCOUNTER — Ambulatory Visit: Payer: Managed Care, Other (non HMO) | Admitting: Family Medicine

## 2022-07-18 VITALS — BP 120/78 | Wt 241.2 lb

## 2022-07-18 DIAGNOSIS — E1169 Type 2 diabetes mellitus with other specified complication: Secondary | ICD-10-CM | POA: Diagnosis not present

## 2022-07-18 DIAGNOSIS — E785 Hyperlipidemia, unspecified: Secondary | ICD-10-CM

## 2022-07-18 DIAGNOSIS — E119 Type 2 diabetes mellitus without complications: Secondary | ICD-10-CM

## 2022-07-18 DIAGNOSIS — Z1211 Encounter for screening for malignant neoplasm of colon: Secondary | ICD-10-CM

## 2022-07-18 DIAGNOSIS — R748 Abnormal levels of other serum enzymes: Secondary | ICD-10-CM | POA: Diagnosis not present

## 2022-07-18 MED ORDER — TIRZEPATIDE 7.5 MG/0.5ML ~~LOC~~ SOAJ: 7.5000 mg | SUBCUTANEOUS | 2 refills | Status: DC

## 2022-07-18 NOTE — Progress Notes (Signed)
   Subjective:    Patient ID: Joanne Little, female    DOB: February 01, 1971, 52 y.o.   MRN: [id number]  HPI Very nice patient She is working really hard at improving her health She is doing a good job with dietary measures She is exercising at least 4 days a week She is taking her medicines on a regular basis Patient arrives today for follow up lab work. Results for orders placed or performed in visit on 06/23/22  Hemoglobin A1c  Result Value Ref Range   Hgb A1c MFr Bld 5.4 4.8 - 5.6 %   Est. average glucose Bld gHb Est-mCnc 108 mg/dL  Lipid panel  Result Value Ref Range   Cholesterol, Total 121 100 - 199 mg/dL   Triglycerides 103 0 - 149 mg/dL   HDL 45 >39 mg/dL   VLDL Cholesterol Cal 19 5 - 40 mg/dL   LDL Chol Calc (NIH) 57 0 - 99 mg/dL   Chol/HDL Ratio 2.7 0.0 - 4.4 ratio  Hepatic function panel  Result Value Ref Range   Total Protein 7.1 6.0 - 8.5 g/dL   Albumin 4.7 3.8 - 4.9 g/dL   Bilirubin Total 0.5 0.0 - 1.2 mg/dL   Bilirubin, Direct 0.15 0.00 - 0.40 mg/dL   Alkaline Phosphatase 63 44 - 121 IU/L   AST 38 0 - 40 IU/L   ALT 59 (H) 0 - 32 IU/L  Basic metabolic panel  Result Value Ref Range   Glucose 86 70 - 99 mg/dL   BUN 12 6 - 24 mg/dL   Creatinine, Ser 0.82 0.57 - 1.00 mg/dL   eGFR 85 >59 mL/min/1.73   BUN/Creatinine Ratio 15 9 - 23   Sodium 139 134 - 144 mmol/L   Potassium 4.5 3.5 - 5.2 mmol/L   Chloride 103 96 - 106 mmol/L   CO2 22 20 - 29 mmol/L   Calcium 9.6 8.7 - 10.2 mg/dL  Microalbumin/Creatinine Ratio, Urine  Result Value Ref Range   Creatinine, Urine 38.4 Not Estab. mg/dL   Microalbumin, Urine <3.0 Not Estab. ug/mL   Microalb/Creat Ratio <8 0 - 29 mg/g creat      Review of Systems     Objective:   Physical Exam General-in no acute distress Eyes-no discharge Lungs-respiratory rate normal, CTA CV-no murmurs,RRR Extremities skin warm dry no edema Neuro grossly normal Behavior normal, alert        Assessment & Plan:  The patient was seen  today as part of a comprehensive visit for diabetes. The importance of keeping her A1c at or below 7 range was discussed.  Discussed diet, activity, and medication compliance Emphasized healthy eating primarily with vegetables fruits and if utilizing meats lean meats such as chicken or fish grilled baked broiled Avoid sugary drinks Minimize and avoid processed foods Fit in regular physical activity preferably 25 to 30 minutes 4 times per week Standard follow-up visit recommended.  Patient aware lack of control and follow-up increases risk of diabetic complications. Regular follow-up visits Yearly ophthalmology Yearly foot exam Bump up dose of Mounjaro new dose 7.5 weekly  Hyperlipidemia-importance of diet, weight control, activity, compliance with medications discussed.   Recent labs reviewed.   Any additional labs or refills ordered.   Importance of keeping under good control discussed. Regular follow-up visits discussed  Fatty liver starting to become under better control liver enzymes start to look better continue healthy diet continue current medication bump up the dose of Mounjaro

## 2022-07-21 NOTE — Addendum Note (Signed)
Addended by: Dairl Ponder on: 07/21/2022 09:42 AM   Modules accepted: Orders

## 2022-07-22 ENCOUNTER — Encounter (INDEPENDENT_AMBULATORY_CARE_PROVIDER_SITE_OTHER): Payer: Self-pay | Admitting: *Deleted

## 2022-08-04 ENCOUNTER — Encounter: Payer: Self-pay | Admitting: Family Medicine

## 2022-08-05 ENCOUNTER — Other Ambulatory Visit: Payer: Self-pay | Admitting: Family Medicine

## 2022-08-05 MED ORDER — TIRZEPATIDE 10 MG/0.5ML ~~LOC~~ SOAJ: 10.0000 mg | SUBCUTANEOUS | 0 refills | Status: DC

## 2022-08-08 ENCOUNTER — Telehealth: Payer: Self-pay

## 2022-08-08 MED ORDER — TIRZEPATIDE 7.5 MG/0.5ML ~~LOC~~ SOAJ: 7.5000 mg | SUBCUTANEOUS | 0 refills | Status: DC

## 2022-08-08 NOTE — Telephone Encounter (Signed)
Prescription sent electronically to pharmacy. Patient notified via my chart

## 2022-08-08 NOTE — Telephone Encounter (Signed)
Pt found tirzepatide [facility]) 10 MG/0.5ML Pen is not in stock but has found 7.5 mg at [facility]

## 2022-08-11 ENCOUNTER — Other Ambulatory Visit: Payer: Self-pay | Admitting: Family Medicine

## 2022-08-11 ENCOUNTER — Telehealth: Payer: Self-pay | Admitting: Family Medicine

## 2022-08-11 MED ORDER — TIRZEPATIDE 10 MG/0.5ML ~~LOC~~ SOAJ: 10.0000 mg | SUBCUTANEOUS | 1 refills | Status: DC

## 2022-08-11 NOTE — Telephone Encounter (Signed)
Duplicate request- see my chart message from today 08/11/22

## 2022-08-11 NOTE — Telephone Encounter (Signed)
Patient is requesting refill for Mounjaro 10 mg sent to [facility]. She states they a have few in stock

## 2022-08-22 ENCOUNTER — Encounter: Payer: Self-pay | Admitting: *Deleted

## 2022-09-10 ENCOUNTER — Other Ambulatory Visit: Payer: Self-pay | Admitting: Family Medicine

## 2022-09-10 DIAGNOSIS — E1169 Type 2 diabetes mellitus with other specified complication: Secondary | ICD-10-CM

## 2022-10-13 ENCOUNTER — Other Ambulatory Visit: Payer: Self-pay | Admitting: Family Medicine

## 2022-10-13 DIAGNOSIS — E119 Type 2 diabetes mellitus without complications: Secondary | ICD-10-CM

## 2022-11-03 ENCOUNTER — Encounter: Payer: Self-pay | Admitting: Family Medicine

## 2022-11-03 NOTE — Telephone Encounter (Signed)
Nurses-May go ahead with Mounjaro 12.5 mg once per week essentially 4 pens with 3 refills Also please make sure that Leaner schedules a follow-up office visit in August and does her blood work before that visit Blood work has already been ordered

## 2022-11-05 ENCOUNTER — Other Ambulatory Visit: Payer: Self-pay

## 2022-11-05 DIAGNOSIS — E119 Type 2 diabetes mellitus without complications: Secondary | ICD-10-CM

## 2022-11-05 MED ORDER — TIRZEPATIDE 12.5 MG/0.5ML ~~LOC~~ SOAJ: 12.5000 mg | SUBCUTANEOUS | 3 refills | Status: DC

## 2022-11-05 NOTE — Telephone Encounter (Signed)
Prescription sent message sent to patient.

## 2022-12-23 ENCOUNTER — Telehealth: Payer: Self-pay | Admitting: Family Medicine

## 2022-12-23 DIAGNOSIS — E119 Type 2 diabetes mellitus without complications: Secondary | ICD-10-CM

## 2022-12-23 DIAGNOSIS — E1169 Type 2 diabetes mellitus with other specified complication: Secondary | ICD-10-CM

## 2022-12-23 MED ORDER — METFORMIN HCL ER 500 MG PO TB24: ORAL_TABLET | ORAL | 0 refills | Status: DC

## 2022-12-23 MED ORDER — ROSUVASTATIN CALCIUM 10 MG PO TABS: 10.0000 mg | ORAL_TABLET | Freq: Every day | ORAL | 0 refills | Status: DC

## 2022-12-23 NOTE — Telephone Encounter (Signed)
30 day scripts sent to pharmacy.

## 2022-12-23 NOTE — Telephone Encounter (Signed)
Patient is requesting refill on  rosuvastatin (CRESTOR) 10 MG tablet and  metFORMIN (GLUCOPHAGE-XR) 500 MG 24 hr tablet  enough until seen 01/20/23 . She has changed pharmacy also please update chart She is going to [facility]

## 2022-12-30 ENCOUNTER — Encounter (INDEPENDENT_AMBULATORY_CARE_PROVIDER_SITE_OTHER): Payer: Self-pay | Admitting: *Deleted

## 2023-01-16 ENCOUNTER — Ambulatory Visit: Payer: Managed Care, Other (non HMO) | Admitting: Family Medicine

## 2023-01-16 LAB — BASIC METABOLIC PANEL
Calcium: 9.8 mg/dL (ref 8.7–10.2)
Chloride: 105 mmol/L (ref 96–106)
Glucose: 84 mg/dL (ref 70–99)

## 2023-01-16 LAB — LIPID PANEL: Chol/HDL Ratio: 2.8 ratio (ref 0.0–4.4)

## 2023-01-20 ENCOUNTER — Other Ambulatory Visit: Payer: Self-pay | Admitting: Family Medicine

## 2023-01-20 ENCOUNTER — Ambulatory Visit: Payer: Managed Care, Other (non HMO) | Admitting: Family Medicine

## 2023-01-20 VITALS — BP 108/76 | HR 85 | Temp 97.9°F | Ht 70.0 in | Wt 207.4 lb

## 2023-01-20 DIAGNOSIS — Z7985 Long-term (current) use of injectable non-insulin antidiabetic drugs: Secondary | ICD-10-CM

## 2023-01-20 DIAGNOSIS — K760 Fatty (change of) liver, not elsewhere classified: Secondary | ICD-10-CM

## 2023-01-20 DIAGNOSIS — Z7984 Long term (current) use of oral hypoglycemic drugs: Secondary | ICD-10-CM

## 2023-01-20 DIAGNOSIS — E119 Type 2 diabetes mellitus without complications: Secondary | ICD-10-CM

## 2023-01-20 DIAGNOSIS — E785 Hyperlipidemia, unspecified: Secondary | ICD-10-CM

## 2023-01-20 DIAGNOSIS — Z1211 Encounter for screening for malignant neoplasm of colon: Secondary | ICD-10-CM | POA: Diagnosis not present

## 2023-01-20 DIAGNOSIS — E1169 Type 2 diabetes mellitus with other specified complication: Secondary | ICD-10-CM | POA: Diagnosis not present

## 2023-01-20 DIAGNOSIS — E663 Overweight: Secondary | ICD-10-CM

## 2023-01-20 DIAGNOSIS — I2510 Atherosclerotic heart disease of native coronary artery without angina pectoris: Secondary | ICD-10-CM

## 2023-01-20 MED ORDER — TIRZEPATIDE 12.5 MG/0.5ML ~~LOC~~ SOAJ
12.5000 mg | SUBCUTANEOUS | 3 refills | Status: DC
Start: 2023-01-20 — End: 2023-03-13

## 2023-01-20 MED ORDER — METFORMIN HCL ER 500 MG PO TB24
ORAL_TABLET | ORAL | 1 refills | Status: AC
Start: 2023-01-20 — End: ?

## 2023-01-20 MED ORDER — ROSUVASTATIN CALCIUM 10 MG PO TABS
10.0000 mg | ORAL_TABLET | Freq: Every day | ORAL | 1 refills | Status: DC
Start: 2023-01-20 — End: 2023-07-07

## 2023-01-20 NOTE — Progress Notes (Addendum)
   Subjective:    Patient ID: Joanne Little, female    DOB: 02-26-1971, 52 y.o.   MRN: [id number]  HPI Pt comes in today for a 6 month follow up visit, Pt has no concerns at this time. Kidney functions look good, electrolytes look good, calcium normal Cholesterol profile very good, liver enzymes look good, A1c 5.0 Patient with diabetes The patient was seen today as part of a comprehensive diabetic check up. Patient has diabetes Patient relates good compliance with taking the medication. We discussed their diet and exercise activities  We also discussed the importance of notifying us if any excessively high glucoses or low sugars.   Previously patient diabetic with morbid obesity and fatty liver along with elevated liver enzymes Peak A1c approximately 3 years ago 7.1 has been on metformin given increased risk for cardiovascular issues and coronary CTA showing coronary artery atherosclerosis patient was placed on Mounjaro has had very good response with weight reduction liver enzymes have improved and A1c has come down to 5.0 On her medications History hyperlipidemia Colon cancer screening - Plan: Ambulatory referral to Gastroenterology   Review of Systems     Objective:   Physical Exam General-in no acute distress Eyes-no discharge Lungs-respiratory rate normal, CTA CV-no murmurs,RRR Extremities skin warm dry no edema Neuro grossly normal Behavior normal, alert        Assessment & Plan:  1. Colon cancer screening Referral local - Ambulatory referral to Gastroenterology  2. Diabetes mellitus without complication (HCC) Under good control tolerating Mounjaro well continue current dosing continue metformin reduce it down to 1 tablet a day follow-up labs 6 months  3. Hyperlipidemia associated with type 2 diabetes mellitus (HCC) Continue statin continue healthy diet  4. Fatty liver Liver enzymes much improved continue Mounjaro continue weight loss  5. Overweight (BMI  25.0-29.9) Patient doing a good job watching portions watching choices plus also exercising regular basis  Patient with coronary artery disease patient had CTA in 2021 which showed atherosclerosis of the coronary arteries, patient former smoker, patient would benefit from ongoing use of GLP-1 to reduce risk of coronary artery disease progression of diabetes complications and fatty liver

## 2023-01-21 ENCOUNTER — Encounter: Payer: Self-pay | Admitting: *Deleted

## 2023-01-21 ENCOUNTER — Telehealth: Payer: Self-pay

## 2023-01-21 ENCOUNTER — Other Ambulatory Visit: Payer: Self-pay

## 2023-01-21 DIAGNOSIS — E119 Type 2 diabetes mellitus without complications: Secondary | ICD-10-CM

## 2023-01-21 MED ORDER — METFORMIN HCL ER 500 MG PO TB24
ORAL_TABLET | ORAL | 1 refills | Status: DC
Start: 2023-01-21 — End: 2023-07-07

## 2023-01-21 NOTE — Telephone Encounter (Signed)
Prescription Request  01/21/2023  LOV: Visit date not found  What is the name of the medication or equipment? metFORMIN (GLUCOPHAGE-XR) 500 MG 24 hr tablet ,ONETOUCH ULTRA test strip , Lancets (ONETOUCH DELICA PLUS LANCET30G) MISC ,rosuvastatin (CRESTOR) 10 MG tablet   Have you contacted your pharmacy to request a refill? Yes   Which pharmacy would you like this sent to?  [facility] [phone] - [place], [place] - [address] AT [facility] [address] [place] [address] Phone: [phone] Fax: [phone]    Patient notified that their request is being sent to the clinical staff for review and that they should receive a response within 2 business days.   Please advise at Mobile [phone] (mobile)

## 2023-02-18 ENCOUNTER — Telehealth: Payer: Self-pay | Admitting: Internal Medicine

## 2023-02-18 NOTE — Telephone Encounter (Signed)
Questionnaire in review

## 2023-02-24 NOTE — Addendum Note (Signed)
Addended by: Armstead Peaks on: 02/24/2023 09:06 AM   Modules accepted: Orders

## 2023-02-24 NOTE — Telephone Encounter (Signed)
  Procedure: Colonoscopy  Estimated body mass index is 29.76 kg/m as calculated from the following:   Height as of 01/20/23: 5\' 10"  (1.778 m).   Weight as of 01/20/23: 207 lb 6.4 oz (94.1 kg).   Have you had a colonoscopy before?  no  Do you have family history of colon cancer?  no  Do you have a family history of polyps? no  Previous colonoscopy with polyps removed? no  Do you have a history colorectal cancer?   no  Are you diabetic?  Type 2  Do you have a prosthetic or mechanical heart valve? no  Do you have a pacemaker/defibrillator?   no  Have you had endocarditis/atrial fibrillation?  no  Do you use supplemental oxygen/CPAP?  no  Have you had joint replacement within the last 12 months?  no  Do you tend to be constipated or have to use laxatives?  no   Do you have history of alcohol use? If yes, how much and how often.  no  Do you have history or are you using drugs? If yes, what do are you  using?  no  Have you ever had a stroke/heart attack?  no  Have you ever had a heart or other vascular stent placed,?no  Do you take weight loss medication? no  female patients,: have you had a hysterectomy? no                              are you post menopausal?                                do you still have your menstrual cycle? no    Date of last menstrual period? 2013   Do you take any blood-thinning medications such as: (Plavix, aspirin, Coumadin, Aggrenox, Brilinta, Xarelto, Eliquis, Pradaxa, Savaysa or Effient)? no  If yes we need the name, milligram, dosage and who is prescribing doctor:               Current Outpatient Medications  Medication Sig Dispense Refill   citalopram (CELEXA) 20 MG tablet Take 20 mg by mouth every evening.      ibuprofen (ADVIL) 200 MG tablet Take 400 mg by mouth every 6 (six) hours as needed for mild pain or moderate pain.     metFORMIN (GLUCOPHAGE-XR) 500 MG 24 hr tablet Take 1 tablets by mouth every day with breakfast 90 tablet 1    rosuvastatin (CRESTOR) 10 MG tablet Take 1 tablet (10 mg total) by mouth daily. 90 tablet 1   tirzepatide (MOUNJARO) 12.5 MG/0.5ML Pen Inject 12.5 mg into the skin once a week. 2 mL 3   No current facility-administered medications for this visit.    No Known Allergies

## 2023-03-13 ENCOUNTER — Other Ambulatory Visit: Payer: Self-pay | Admitting: Family Medicine

## 2023-03-13 DIAGNOSIS — E119 Type 2 diabetes mellitus without complications: Secondary | ICD-10-CM

## 2023-03-26 ENCOUNTER — Telehealth: Payer: Self-pay | Admitting: Family Medicine

## 2023-03-26 NOTE — Telephone Encounter (Signed)
Nurses It will be important to go ahead and talk with Joanne Little.  Please inform her of the difficulty getting her medication approved Feel free to share with her what many insurance companies are doing nowadays where when a person's A1c has been under good control for period of time the insurance company will want to state that the medication is not needed any longer or that the medicine is now being used for weight loss and not for treatment of diabetes.  We can do an appeal letter to go to the insurance company but this is going to be a uphill battle that may or may not turn out favorably.  We did have our clinical pharmacist look at this and she stated how many insurance companies are writing their rules that prevent individuals from being on long-term GLP-1 medicines when they are A1c's are back within a normal range  Once again I am willing to do a appeal letter but outcome of approval of the medicine is not guaranteed  In the situations if a person does come off the medicine we do recommend following up lab work in 3 to 4 months and if A1c starts getting back into the diabetic zone we recommend reinitiating  Please talk with patient make sure she is aware of all this  Feel free to share with her what you have already done on her behalf thank you and if she is open to [place] doing an appeal please let us know.

## 2023-03-26 NOTE — Telephone Encounter (Signed)
Patient notified and would like to try to do a letter of appeal-  The appeal fax number is [phone]

## 2023-03-30 ENCOUNTER — Encounter: Payer: Self-pay | Admitting: Family Medicine

## 2023-03-30 NOTE — Progress Notes (Signed)
Please print this letter to have me sign it, then the nurses/Autumn can file the appeal

## 2023-03-31 NOTE — Telephone Encounter (Signed)
Hi Autumn I did do a letter of appeal.  Please process and send patient notification thank you

## 2023-04-01 ENCOUNTER — Encounter: Payer: Self-pay | Admitting: *Deleted

## 2023-04-01 NOTE — Telephone Encounter (Signed)
Letter of Appeal faxed to insurance. Patient notified via my chart

## 2023-06-10 ENCOUNTER — Encounter: Payer: Self-pay | Admitting: Family Medicine

## 2023-06-17 NOTE — Telephone Encounter (Signed)
This triage appears old from 02/2023. Assuming no change in medications then orders as follows. Please make sure medications updated. Ok to schedule.  ASA 2 Hold mounjaro 7 days Day of procedure, hold metformin

## 2023-06-22 MED ORDER — NA SULFATE-K SULFATE-MG SULF 17.5-3.13-1.6 GM/177ML PO SOLN: 1.0000 | Freq: Once | ORAL | 0 refills | Status: AC

## 2023-06-22 NOTE — Telephone Encounter (Signed)
Spoke with pt. Scheduled with Dr. Jena Gauss 2/20 at 9:$5am. Aware will send rx to pharmacy. Instructions to be mailed. Also aware of meds to hold and when

## 2023-06-22 NOTE — Addendum Note (Signed)
Addended by: Armstead Peaks on: 06/22/2023 01:52 PM   Modules accepted: Orders

## 2023-06-23 ENCOUNTER — Encounter (INDEPENDENT_AMBULATORY_CARE_PROVIDER_SITE_OTHER): Payer: Self-pay | Admitting: *Deleted

## 2023-06-23 NOTE — Telephone Encounter (Signed)
Referral completed, [facility] apt letter sent to PCP

## 2023-07-03 ENCOUNTER — Other Ambulatory Visit: Payer: Self-pay | Admitting: Nurse Practitioner

## 2023-07-03 DIAGNOSIS — K760 Fatty (change of) liver, not elsewhere classified: Secondary | ICD-10-CM

## 2023-07-03 DIAGNOSIS — R5383 Other fatigue: Secondary | ICD-10-CM

## 2023-07-03 DIAGNOSIS — E1169 Type 2 diabetes mellitus with other specified complication: Secondary | ICD-10-CM

## 2023-07-03 DIAGNOSIS — E119 Type 2 diabetes mellitus without complications: Secondary | ICD-10-CM

## 2023-07-04 LAB — MICROALBUMIN / CREATININE URINE RATIO
Creatinine, Urine: 185.4 mg/dL
Microalb/Creat Ratio: 4 mg/g{creat} (ref 0–29)
Microalbumin, Urine: 7.6 ug/mL

## 2023-07-04 LAB — COMPREHENSIVE METABOLIC PANEL
ALT: 13 [IU]/L (ref 0–32)
AST: 15 [IU]/L (ref 0–40)
Albumin: 4.5 g/dL (ref 3.8–4.9)
Alkaline Phosphatase: 54 [IU]/L (ref 44–121)
BUN/Creatinine Ratio: 20 (ref 9–23)
BUN: 19 mg/dL (ref 6–24)
Bilirubin Total: 0.4 mg/dL (ref 0.0–1.2)
CO2: 21 mmol/L (ref 20–29)
Calcium: 9.5 mg/dL (ref 8.7–10.2)
Chloride: 103 mmol/L (ref 96–106)
Creatinine, Ser: 0.97 mg/dL (ref 0.57–1.00)
Globulin, Total: 2.2 g/dL (ref 1.5–4.5)
Glucose: 81 mg/dL (ref 70–99)
Potassium: 4.5 mmol/L (ref 3.5–5.2)
Sodium: 138 mmol/L (ref 134–144)
Total Protein: 6.7 g/dL (ref 6.0–8.5)
eGFR: 69 mL/min/{1.73_m2} (ref >59)

## 2023-07-04 LAB — CBC WITH DIFFERENTIAL/PLATELET
Basophils Absolute: 0 10*3/uL (ref 0.0–0.2)
Basos: 0 %
EOS (ABSOLUTE): 0.2 10*3/uL (ref 0.0–0.4)
Eos: 2 %
Hematocrit: 40.5 % (ref 34.0–46.6)
Hemoglobin: 13.6 g/dL (ref 11.1–15.9)
Immature Grans (Abs): 0 10*3/uL (ref 0.0–0.1)
Immature Granulocytes: 0 %
Lymphocytes Absolute: 2.3 10*3/uL (ref 0.7–3.1)
Lymphs: 31 %
MCH: 30.7 pg (ref 26.6–33.0)
MCHC: 33.6 g/dL (ref 31.5–35.7)
MCV: 91 fL (ref 79–97)
Monocytes Absolute: 0.5 10*3/uL (ref 0.1–0.9)
Monocytes: 7 %
Neutrophils Absolute: 4.3 10*3/uL (ref 1.4–7.0)
Neutrophils: 60 %
Platelets: 257 10*3/uL (ref 150–450)
RBC: 4.43 x10E6/uL (ref 3.77–5.28)
RDW: 12.4 % (ref 11.7–15.4)
WBC: 7.3 10*3/uL (ref 3.4–10.8)

## 2023-07-04 LAB — LIPID PANEL
Chol/HDL Ratio: 3.1 {ratio} (ref 0.0–4.4)
Cholesterol, Total: 150 mg/dL (ref 100–199)
HDL: 49 mg/dL (ref >39)
LDL Chol Calc (NIH): 80 mg/dL (ref 0–99)
Triglycerides: 114 mg/dL (ref 0–149)
VLDL Cholesterol Cal: 21 mg/dL (ref 5–40)

## 2023-07-04 LAB — HEMOGLOBIN A1C
Est. average glucose Bld gHb Est-mCnc: 97 mg/dL
Hgb A1c MFr Bld: 5 % (ref 4.8–5.6)

## 2023-07-07 ENCOUNTER — Ambulatory Visit: Payer: Managed Care, Other (non HMO) | Admitting: Family Medicine

## 2023-07-07 DIAGNOSIS — E785 Hyperlipidemia, unspecified: Secondary | ICD-10-CM | POA: Diagnosis not present

## 2023-07-07 DIAGNOSIS — Z7984 Long term (current) use of oral hypoglycemic drugs: Secondary | ICD-10-CM

## 2023-07-07 DIAGNOSIS — E1169 Type 2 diabetes mellitus with other specified complication: Secondary | ICD-10-CM | POA: Diagnosis not present

## 2023-07-07 DIAGNOSIS — E119 Type 2 diabetes mellitus without complications: Secondary | ICD-10-CM

## 2023-07-07 MED ORDER — ROSUVASTATIN CALCIUM 10 MG PO TABS: 10.0000 mg | ORAL_TABLET | Freq: Every day | ORAL | 1 refills | Status: DC

## 2023-07-07 MED ORDER — METFORMIN HCL ER 500 MG PO TB24: ORAL_TABLET | ORAL | 1 refills | Status: DC

## 2023-07-07 MED ORDER — LANCETS MICRO THIN 33G MISC: 2 refills | Status: AC

## 2023-07-07 MED ORDER — GLUCOSE BLOOD VI STRP: 1.0000 | ORAL_STRIP | Freq: Every day | 2 refills | Status: AC

## 2023-07-07 MED ORDER — MOUNJARO 12.5 MG/0.5ML ~~LOC~~ SOAJ: 12.5000 mg | SUBCUTANEOUS | 3 refills | Status: DC

## 2023-07-07 NOTE — Progress Notes (Signed)
   Subjective:    Patient ID: Joanne Little, female    DOB: [birth year], 53 y.o.   MRN: [id number]  Discussed the use of AI scribe software for clinical note transcription with the patient, who gave verbal consent to proceed.  History of Present Illness   Joanne Little is a 53 year old female with type 2 diabetes and hyperlipidemia who presents for a routine follow-up visit.  She has been managing her type 2 diabetes with medication and lifestyle changes. Her hemoglobin A1c has improved significantly from 6.6 a year ago to 5.0 currently, indicating excellent glycemic control. She is tolerating her current diabetes medication, Mounjaro, well, with only mild nausea when the dose was increased to 12.5 mg, which has since resolved.  Her hyperlipidemia management includes the use of rosuvastatin. Her LDL cholesterol has increased to 80 mg/dL from 59 mg/dL six months ago, which is above the target of less than 70 mg/dL. This increase may be influenced by dietary factors or genetic predisposition. She is consistent with her medication regimen, taking rosuvastatin, metformin, and citalopram as prescribed. No negative side effects from these medications.  Her lifestyle includes a balanced diet and regular exercise. She has lost weight, decreasing from 256 pounds in December 2021 to 196 pounds currently. Her dietary habits include high protein oatmeal for breakfast, a small lunch with vegetables and lean meat, and portion-controlled dinners. She drinks water throughout the day with one coffee and exercises three to five times a week.  She has a family history of hyperlipidemia, with her LDL cholesterol historically being higher than desired since her early thirties.         Review of Systems     Objective:    Physical Exam   MEASUREMENTS: WT- 196 lbs CARDIOVASCULAR: Heart sounds normal. EXTREMITIES: No swelling at ankles. NEUROLOGICAL: Sensation to light touch intact around little toe, under big toe,  and on inside of foot.           Assessment & Plan:  Assessment and Plan    Type 2 Diabetes Mellitus Well controlled with A1c of 5.0, down from 6.6 a year ago. Patient is adherent to Metformin and Mounjaro, with no reported side effects. Maintaining healthy diet and regular exercise. -Continue current regimen of Metformin and Mounjaro. -Encourage continued healthy lifestyle habits.  Hyperlipidemia LDL increased to 80 from 57 a year ago, despite adherence to Rosuvastatin. Discussed potential need for dose adjustment if trend continues. -Continue Rosuvastatin 10mg  daily. -Recheck lipid panel at next visit to assess need for dose adjustment.  Fatty Liver Disease Liver enzymes are normal, indicating good control of fatty liver disease. -Continue current management.  General Health Maintenance -Encourage annual eye exam to monitor for diabetic retinopathy. -Advise patient to consider annual flu vaccination. -Plan for updated pneumococcal vaccine next year (Pneumococcal 20). -Schedule follow-up visit in 6 months.      Stress is doing well tolerating her job well taking citalopram without trouble Diabetes under good control Mounjaro is helping immensely continue the metformin as well as Mounjaro continue healthy eating and exercise Hyperlipidemia LDL not quite at goal patient would like to try harder and repeat lab work again within 6 months at that time if LDL above goal will adjust medicine

## 2023-07-08 ENCOUNTER — Encounter: Payer: Self-pay | Admitting: Family Medicine

## 2023-07-09 ENCOUNTER — Other Ambulatory Visit: Payer: Self-pay | Admitting: Family Medicine

## 2023-07-09 DIAGNOSIS — E119 Type 2 diabetes mellitus without complications: Secondary | ICD-10-CM

## 2023-07-16 ENCOUNTER — Encounter (HOSPITAL_COMMUNITY): Payer: Self-pay | Admitting: Internal Medicine

## 2023-07-16 ENCOUNTER — Ambulatory Visit (HOSPITAL_COMMUNITY): Payer: Managed Care, Other (non HMO) | Admitting: Anesthesiology

## 2023-07-16 ENCOUNTER — Encounter (HOSPITAL_COMMUNITY)
Admission: RE | Disposition: A | Discharge status: Home / Self Care | Payer: Self-pay | Source: Home / Self Care | Attending: Internal Medicine

## 2023-07-16 ENCOUNTER — Other Ambulatory Visit: Payer: Self-pay

## 2023-07-16 ENCOUNTER — Ambulatory Visit (HOSPITAL_COMMUNITY)
Admission: RE | Admit: 2023-07-16 | Discharge: 2023-07-16 | Disposition: A | Discharge status: Home / Self Care | Payer: Managed Care, Other (non HMO) | Attending: Internal Medicine | Admitting: Internal Medicine

## 2023-07-16 ENCOUNTER — Ambulatory Visit (HOSPITAL_BASED_OUTPATIENT_CLINIC_OR_DEPARTMENT_OTHER): Payer: Managed Care, Other (non HMO) | Admitting: Anesthesiology

## 2023-07-16 DIAGNOSIS — K635 Polyp of colon: Secondary | ICD-10-CM | POA: Insufficient documentation

## 2023-07-16 DIAGNOSIS — F419 Anxiety disorder, unspecified: Secondary | ICD-10-CM | POA: Insufficient documentation

## 2023-07-16 DIAGNOSIS — I10 Essential (primary) hypertension: Secondary | ICD-10-CM | POA: Diagnosis not present

## 2023-07-16 DIAGNOSIS — Z1211 Encounter for screening for malignant neoplasm of colon: Secondary | ICD-10-CM | POA: Insufficient documentation

## 2023-07-16 DIAGNOSIS — E119 Type 2 diabetes mellitus without complications: Secondary | ICD-10-CM | POA: Insufficient documentation

## 2023-07-16 DIAGNOSIS — Z87891 Personal history of nicotine dependence: Secondary | ICD-10-CM | POA: Diagnosis not present

## 2023-07-16 DIAGNOSIS — I2510 Atherosclerotic heart disease of native coronary artery without angina pectoris: Secondary | ICD-10-CM | POA: Diagnosis not present

## 2023-07-16 DIAGNOSIS — Z7984 Long term (current) use of oral hypoglycemic drugs: Secondary | ICD-10-CM | POA: Diagnosis not present

## 2023-07-16 HISTORY — PX: COLONOSCOPY WITH PROPOFOL: SHX5780

## 2023-07-16 HISTORY — PX: POLYPECTOMY: SHX5525

## 2023-07-16 HISTORY — DX: Type 2 diabetes mellitus without complications: E11.9

## 2023-07-16 LAB — GLUCOSE, CAPILLARY: Glucose-Capillary: 82 mg/dL (ref 70–99)

## 2023-07-16 SURGERY — COLONOSCOPY WITH PROPOFOL
Anesthesia: General

## 2023-07-16 MED ORDER — LACTATED RINGERS IV SOLN: INTRAVENOUS | Status: DC

## 2023-07-16 MED ORDER — PROPOFOL 10 MG/ML IV BOLUS
INTRAVENOUS | Status: DC | PRN
  Administered 2023-07-16: 100 mg via INTRAVENOUS

## 2023-07-16 MED ORDER — LIDOCAINE HCL (PF) 2 % IJ SOLN
INTRAMUSCULAR | Status: DC | PRN
  Administered 2023-07-16: 50 mg via INTRADERMAL

## 2023-07-16 MED ORDER — PROPOFOL 500 MG/50ML IV EMUL
INTRAVENOUS | Status: DC | PRN
  Administered 2023-07-16: 150 ug/kg/min via INTRAVENOUS

## 2023-07-16 NOTE — H&P (Signed)
 @LOGO @   Primary Care Physician:  Babs Sciara, MD Primary Gastroenterologist:  Dr. Jena Gauss  Pre-Procedure History & Physical: HPI:  Joanne Little is a 53 y.o. female here for first ever screening colonoscopy.  No bowel symptoms.  No family history colon cancer.  Past Medical History:  Diagnosis Date   Abnormal Pap smear    Anxiety    Aortic atherosclerosis (HCC) 08/05/2020   Seen on CT scan 2021   Diabetes mellitus without complication (HCC)    Headache(784.0)    Obesity (BMI 35.0-39.9 without comorbidity) 07/04/2019   Urinary tract infection     Past Surgical History:  Procedure Laterality Date   NO PAST SURGERIES      Prior to Admission medications   Medication Sig Start Date End Date Taking? Authorizing Provider  citalopram (CELEXA) 20 MG tablet Take 20 mg by mouth every evening.     [provider]  glucose blood test strip 1 each by Other route daily. Use as instructed 07/07/23   Babs Sciara, MD  ibuprofen (ADVIL) 200 MG tablet Take 400 mg by mouth every 6 (six) hours as needed for mild pain or moderate pain.    [provider]  Lancets Micro Thin 33G MISC Daily as instructed 07/07/23   Babs Sciara, MD  metFORMIN (GLUCOPHAGE-XR) 500 MG 24 hr tablet Take 1 tablets by mouth every day with breakfast 07/07/23   Babs Sciara, MD  MOUNJARO 12.5 MG/0.5ML Pen ADMINISTER 12.5 MG UNDER THE SKIN 1 TIME A WEEK 07/09/23   Babs Sciara, MD  rosuvastatin (CRESTOR) 10 MG tablet Take 1 tablet (10 mg total) by mouth daily. 07/07/23   Babs Sciara, MD    Allergies as of 06/22/2023   (No Known Allergies)    Family History  Problem Relation Age of Onset   COPD Mother    High Cholesterol Mother    High blood pressure Mother     Social History   Socioeconomic History   Marital status: Married    Spouse name: Not on file   Number of children: Not on file   Years of education: Not on file   Highest education level: Not on file  Occupational History    Not on file  Tobacco Use   Smoking status: Former    Current packs/day: 0.00    Average packs/day: 0.3 packs/day for 34.3 years (8.6 ttl pk-yrs)    Types: Cigarettes    Start date: 03/28/1985    Quit date: 06/30/2019    Years since quitting: 4.0   Smokeless tobacco: Never  Substance and Sexual Activity   Alcohol use: No   Drug use: No   Sexual activity: Not Currently  Other Topics Concern   Not on file  Social History Narrative   Not on file   Social Drivers of Health   Financial Resource Strain: Not on file  Food Insecurity: Not on file  Transportation Needs: Not on file  Physical Activity: Not on file  Stress: Not on file  Social Connections: Not on file  Intimate Partner Violence: Not on file    Review of Systems: See HPI, otherwise negative ROS  Physical Exam: Pulse 65   Temp 97.7 F (36.5 C) (Oral)   Resp 11   Wt 87.5 kg   LMP 03/07/2010   SpO2 100%   BMI 27.69 kg/m  General:   Alert,  Well-developed, well-nourished, pleasant and cooperative in NAD Skin:  Intact without significant lesions or rashes.  Eyes:  Sclera clear, no icterus.   Conjunctiva pink. Ears:  Normal auditory acuity. Nose:  No deformity, discharge,  or lesions. Mouth:  No deformity or lesions. Neck:  Supple; no masses or thyromegaly. No significant cervical adenopathy. Lungs:  Clear throughout to auscultation.   No wheezes, crackles, or rhonchi. No acute distress. Heart:  Regular rate and rhythm; no murmurs, clicks, rubs,  or gallops. Abdomen: Non-distended, normal bowel sounds.  Soft and nontender without appreciable mass or hepatosplenomegaly.   Impression/Plan: 53 year old lady here for first ever average risk screening colonoscopy. The risks, benefits, limitations, alternatives and imponderables have been reviewed with the patient. Questions have been answered. All parties are agreeable.       Notice: This dictation was prepared with Dragon dictation along with smaller phrase  technology. Any transcriptional errors that result from this process are unintentional and may not be corrected upon review.

## 2023-07-16 NOTE — Anesthesia Preprocedure Evaluation (Signed)
 Anesthesia Evaluation  Patient identified by MRN, date of birth, ID band Patient awake    Reviewed: Allergy & Precautions, H&P , NPO status , Patient's Chart, lab work & pertinent test results, reviewed documented beta blocker date and time   Airway Mallampati: II  TM Distance: >3 FB Neck ROM: full    Dental no notable dental hx.    Pulmonary neg pulmonary ROS, former smoker   Pulmonary exam normal breath sounds clear to auscultation       Cardiovascular Exercise Tolerance: Good hypertension, + CAD  negative cardio ROS  Rhythm:regular Rate:Normal     Neuro/Psych  Headaches  Anxiety     negative neurological ROS  negative psych ROS   GI/Hepatic negative GI ROS, Neg liver ROS,,,  Endo/Other  negative endocrine ROSdiabetes    Renal/GU negative Renal ROS  negative genitourinary   Musculoskeletal   Abdominal   Peds  Hematology negative hematology ROS (+)   Anesthesia Other Findings   Reproductive/Obstetrics negative OB ROS                             Anesthesia Physical Anesthesia Plan  ASA: 2  Anesthesia Plan: General   Post-op Pain Management:    Induction:   PONV Risk Score and Plan: Propofol infusion  Airway Management Planned:   Additional Equipment:   Intra-op Plan:   Post-operative Plan:   Informed Consent: I have reviewed the patients History and Physical, chart, labs and discussed the procedure including the risks, benefits and alternatives for the proposed anesthesia with the patient or authorized representative who has indicated his/her understanding and acceptance.     Dental Advisory Given  Plan Discussed with: CRNA  Anesthesia Plan Comments:        Anesthesia Quick Evaluation

## 2023-07-16 NOTE — Discharge Instructions (Addendum)
  Colonoscopy Discharge Instructions  Read the instructions outlined below and refer to this sheet in the next few weeks. These discharge instructions provide you with general information on caring for yourself after you leave the hospital. Your doctor may also give you specific instructions. While your treatment has been planned according to the most current medical practices available, unavoidable complications occasionally occur. If you have any problems or questions after discharge, call Dr. Jena Gauss at [phone]. ACTIVITY You may resume your regular activity, but move at a slower pace for the next 24 hours.  Take frequent rest periods for the next 24 hours.  Walking will help get rid of the air and reduce the bloated feeling in your belly (abdomen).  No driving for 24 hours (because of the medicine (anesthesia) used during the test).   Do not sign any important legal documents or operate any machinery for 24 hours (because of the anesthesia used during the test).  NUTRITION Drink plenty of fluids.  You may resume your normal diet as instructed by your doctor.  Begin with a light meal and progress to your normal diet. Heavy or fried foods are harder to digest and may make you feel sick to your stomach (nauseated).  Avoid alcoholic beverages for 24 hours or as instructed.  MEDICATIONS You may resume your normal medications unless your doctor tells you otherwise.  WHAT YOU CAN EXPECT TODAY Some feelings of bloating in the abdomen.  Passage of more gas than usual.  Spotting of blood in your stool or on the toilet paper.  IF YOU HAD POLYPS REMOVED DURING THE COLONOSCOPY: No aspirin products for 7 days or as instructed.  No alcohol for 7 days or as instructed.  Eat a soft diet for the next 24 hours.  FINDING OUT THE RESULTS OF YOUR TEST Not all test results are available during your visit. If your test results are not back during the visit, make an appointment with your caregiver to find out the  results. Do not assume everything is normal if you have not heard from your caregiver or the medical facility. It is important for you to follow up on all of your test results.  SEEK IMMEDIATE MEDICAL ATTENTION IF: You have more than a spotting of blood in your stool.  Your belly is swollen (abdominal distention).  You are nauseated or vomiting.  You have a temperature over 101.  You have abdominal pain or discomfort that is severe or gets worse throughout the day.    1 small polyp found and removed   further recommendations to follow after pathology report is available for review   at patient request, I called Thyra Breed  at [phone] -  unable to reach

## 2023-07-16 NOTE — Transfer of Care (Signed)
 Immediate Anesthesia Transfer of Care Note  Patient: Joanne Little  Procedure(s) Performed: COLONOSCOPY WITH PROPOFOL POLYPECTOMY  Patient Location: Endoscopy Unit  Anesthesia Type:General  Level of Consciousness: awake, alert , and oriented  Airway & Oxygen Therapy: Patient Spontanous Breathing  Post-op Assessment: Report given to RN and Post -op Vital signs reviewed and stable  Post vital signs: Reviewed and stable  Last Vitals:  Vitals Value Taken Time  BP    Temp    Pulse    Resp    SpO2      Last Pain:  Vitals:   07/16/23 0829  TempSrc:   PainSc: 0-No pain      Patients Stated Pain Goal: 8 (07/16/23 0804)  Complications: No notable events documented.

## 2023-07-16 NOTE — Op Note (Signed)
 [facility] Patient Name: Joanne Little Procedure Date: 07/16/2023 8:12 AM MRN: [id number] Date of Birth: 1971-01-15 Attending MD: Gennette Pac , MD, [id number] CSN: [id number] Age: 53 Admit Type: Outpatient Procedure:                Colonoscopy Indications:              Screening for colorectal malignant neoplasm Providers:                Gennette Pac, MD, Buel Ream. Museum/gallery exhibitions officer, Charity fundraiser,                            Judeth Cornfield. Jessee Avers, Technician Referring MD:              Medicines:                Propofol per Anesthesia Complications:            No immediate complications. Estimated Blood Loss:     Estimated blood loss was minimal. Procedure:                Pre-Anesthesia Assessment:                           - Prior to the procedure, a History and Physical                            was performed, and patient medications and                            allergies were reviewed. The patient's tolerance of                            previous anesthesia was also reviewed. The risks                            and benefits of the procedure and the sedation                            options and risks were discussed with the patient.                            All questions were answered, and informed consent                            was obtained. Prior Anticoagulants: The patient has                            taken no anticoagulant or antiplatelet agents. ASA                            Grade Assessment: II - A patient with mild systemic                            disease. After reviewing the risks and benefits,  the patient was deemed in satisfactory condition to                            undergo the procedure.                           After obtaining informed consent, the colonoscope                            was passed under direct vision. Throughout the                            procedure, the patient's blood pressure, pulse, and                             oxygen saturations were monitored continuously. The                            [id number]) scope was introduced through the                            anus and advanced to the the cecum, identified by                            appendiceal orifice and ileocecal valve. The                            colonoscopy was performed without difficulty. The                            patient tolerated the procedure well. The quality                            of the bowel preparation was adequate. The                            ileocecal valve, appendiceal orifice, and rectum                            were photographed. Scope In: 8:37:45 AM Scope Out: 8:57:41 AM Scope Withdrawal Time: 0 hours 13 minutes 53 seconds  Total Procedure Duration: 0 hours 19 minutes 56 seconds  Findings:      The perianal and digital rectal examinations were normal. Bilobed       ileocecal valve. Normal-appearing terminal ileum.      A 4 mm polyp was found in the recto-sigmoid colon. The polyp was       sessile. The polyp was removed with a cold snare. Resection and       retrieval were complete. Estimated blood loss was minimal.      The exam was otherwise without abnormality on direct views. Read       retroflexion not feasible because the rectal vault was small.. Impression:               - One 4 mm polyp at the recto-sigmoid colon,  removed with a cold snare. Resected and retrieved.                            Bilobed ileocecal valve. Normal-appearing terminal                            ileum.                           - The examination was otherwise normal on direct                            views. Moderate Sedation:      Moderate (conscious) sedation was personally administered by an       anesthesia professional. The following parameters were monitored: oxygen       saturation, heart rate, blood pressure, respiratory rate, EKG, adequacy       of pulmonary  ventilation, and response to care. Recommendation:           - Patient has a contact number available for                            emergencies. The signs and symptoms of potential                            delayed complications were discussed with the                            patient. Return to normal activities tomorrow.                            Written discharge instructions were provided to the                            patient.                           - Advance diet as tolerated.                           - Continue present medications.                           - Repeat colonoscopy date to be determined after                            pending pathology results are reviewed for                            surveillance.                           - Return to GI office (date not yet determined). Procedure Code(s):        --- Professional ---                           [phone], Colonoscopy,  flexible; with removal of                            tumor(s), polyp(s), or other lesion(s) by snare                            technique Diagnosis Code(s):        --- Professional ---                           Z12.11, Encounter for screening for malignant                            neoplasm of colon                           D12.7, Benign neoplasm of rectosigmoid junction CPT copyright 2022 American Medical Association. All rights reserved. The codes documented in this report are preliminary and upon coder review may  be revised to meet current compliance requirements. Gerrit Friends. Marcos Ruelas, MD Gennette Pac, MD 07/16/2023 9:28:23 AM This report has been signed electronically. Number of Addenda: 0

## 2023-07-16 NOTE — Anesthesia Procedure Notes (Addendum)
 Date/Time: 07/16/2023 8:28 AM  Performed by: Julian Reil, CRNAPre-anesthesia Checklist: Patient identified, Emergency Drugs available, Suction available and Patient being monitored Patient Re-evaluated:Patient Re-evaluated prior to induction Oxygen Delivery Method: Nasal cannula Induction Type: IV induction Placement Confirmation: positive ETCO2

## 2023-07-17 ENCOUNTER — Encounter (HOSPITAL_COMMUNITY): Payer: Self-pay | Admitting: Internal Medicine

## 2023-07-17 LAB — SURGICAL PATHOLOGY

## 2023-07-17 NOTE — Anesthesia Postprocedure Evaluation (Signed)
 Anesthesia Post Note  Patient: Joanne Little  Procedure(s) Performed: COLONOSCOPY WITH PROPOFOL POLYPECTOMY  Patient location during evaluation: Phase II Anesthesia Type: General Level of consciousness: awake Pain management: pain level controlled Vital Signs Assessment: post-procedure vital signs reviewed and stable Respiratory status: spontaneous breathing and respiratory function stable Cardiovascular status: blood pressure returned to baseline and stable Postop Assessment: no headache and no apparent nausea or vomiting Anesthetic complications: no Comments: Late entry   No notable events documented.   Last Vitals:  Vitals:   07/16/23 0804 07/16/23 0900  BP: (!) 128/97 95/60  Pulse: 65 73  Resp: 11 12  Temp: 36.5 C 36.8 C  SpO2: 100% 98%    Last Pain:  Vitals:   07/16/23 0900  TempSrc: Oral  PainSc: 0-No pain                 Windell Norfolk

## 2023-07-18 ENCOUNTER — Encounter: Payer: Self-pay | Admitting: Internal Medicine

## 2023-07-23 ENCOUNTER — Ambulatory Visit: Payer: Managed Care, Other (non HMO) | Admitting: Family Medicine

## 2023-09-21 ENCOUNTER — Telehealth: Payer: Self-pay | Admitting: Pharmacy Technician

## 2023-09-21 ENCOUNTER — Other Ambulatory Visit (HOSPITAL_COMMUNITY): Payer: Self-pay

## 2023-09-21 NOTE — Telephone Encounter (Signed)
 Pharmacy Patient Advocate Encounter   Received notification from CoverMyMeds that prior authorization for Mounjaro  12.5MG /0.5ML auto-injectors is required/requested.   Insurance verification completed.   The patient is insured through [employer] .   Per test claim: PA required; PA submitted to above mentioned insurance via CoverMyMeds Key/confirmation #/EOC [facility] Status is pending

## 2023-09-25 ENCOUNTER — Other Ambulatory Visit (HOSPITAL_COMMUNITY): Payer: Self-pay

## 2023-09-25 NOTE — Telephone Encounter (Signed)
 Pharmacy Patient Advocate Encounter  Received notification from [employer] that Prior Authorization for Mounjaro  12.5MG /0.5ML auto-injectors has been APPROVED from 09/25/2023 to 09/24/2024. Unable to obtain price due to refill too soon rejection, last fill date 09/24/2023 next available fill date05/14/2025.   PA #/Case ID/Reference #: [id number]

## 2023-09-29 ENCOUNTER — Telehealth: Payer: Self-pay | Admitting: Family Medicine

## 2023-09-29 ENCOUNTER — Encounter: Payer: Self-pay | Admitting: Family Medicine

## 2023-09-29 NOTE — Telephone Encounter (Signed)
 I did a letter of appeal Please have the PA team filed this letter of appeal to try to get her Mounjaro  approved I would recommend sending most recent office visit plus also previous A1c's with this letter thank you-Dr. Geralyn Knee

## 2023-09-29 NOTE — Progress Notes (Signed)
 Please forward this to Autumn so that she can file an appeal

## 2023-09-30 ENCOUNTER — Other Ambulatory Visit (HOSPITAL_COMMUNITY): Payer: Self-pay

## 2023-09-30 NOTE — Telephone Encounter (Addendum)
 Appeal information sent to PA team

## 2023-09-30 NOTE — Telephone Encounter (Signed)
 PA request has been Approved. New Encounter has been or will be created for follow up. For additional info see Pharmacy Prior Auth telephone encounter from 09/21/2023.

## 2023-10-16 ENCOUNTER — Telehealth: Payer: Self-pay | Admitting: Pharmacy Technician

## 2023-10-16 ENCOUNTER — Other Ambulatory Visit (HOSPITAL_COMMUNITY): Payer: Self-pay

## 2023-10-16 NOTE — Telephone Encounter (Signed)
 Pharmacy Patient Advocate Encounter   Received notification from CoverMyMeds that prior authorization for Mounjaro  12.5MG /0.5ML auto-injectors is required/requested.   Insurance verification completed.   The patient is insured through [employer] .   Per test claim: Refill too soon. PA is not needed at this time. Medication was filled 09/24/2023. Next eligible fill date is 10/17/2023.

## 2023-11-02 ENCOUNTER — Encounter: Payer: Self-pay | Admitting: Family Medicine

## 2023-11-12 ENCOUNTER — Other Ambulatory Visit: Payer: Self-pay | Admitting: Family Medicine

## 2023-11-12 DIAGNOSIS — E119 Type 2 diabetes mellitus without complications: Secondary | ICD-10-CM

## 2024-01-04 ENCOUNTER — Other Ambulatory Visit: Payer: Self-pay

## 2024-01-04 ENCOUNTER — Telehealth: Payer: Self-pay

## 2024-01-04 ENCOUNTER — Ambulatory Visit: Payer: Managed Care, Other (non HMO) | Admitting: Family Medicine

## 2024-01-04 DIAGNOSIS — E1169 Type 2 diabetes mellitus with other specified complication: Secondary | ICD-10-CM

## 2024-01-04 MED ORDER — ROSUVASTATIN CALCIUM 10 MG PO TABS
10.0000 mg | ORAL_TABLET | Freq: Every day | ORAL | 1 refills | Status: DC
Start: 2024-01-04 — End: 2024-04-20

## 2024-01-04 NOTE — Telephone Encounter (Signed)
 Prescription Request  01/04/2024  LOV: Visit date not found  What is the name of the medication or equipment?   rosuvastatin  (CRESTOR ) 10 MG tablet    Have you contacted your pharmacy to request a refill? Yes   Which pharmacy would you like this sent to?  [facility] [phone] - [place],  - [address] AT [facility] [address] [place] [address] Phone: [phone] Fax: [phone]    Patient notified that their request is being sent to the clinical staff for review and that they should receive a response within 2 business days.   Please advise at Mobile [phone] (mobile)

## 2024-01-05 ENCOUNTER — Other Ambulatory Visit: Payer: Self-pay | Admitting: Family Medicine

## 2024-01-05 DIAGNOSIS — E119 Type 2 diabetes mellitus without complications: Secondary | ICD-10-CM

## 2024-03-07 ENCOUNTER — Other Ambulatory Visit: Payer: Self-pay | Admitting: Nurse Practitioner

## 2024-03-07 ENCOUNTER — Telehealth: Payer: Self-pay | Admitting: Family Medicine

## 2024-03-07 DIAGNOSIS — E119 Type 2 diabetes mellitus without complications: Secondary | ICD-10-CM

## 2024-03-07 MED ORDER — MOUNJARO 12.5 MG/0.5ML ~~LOC~~ SOAJ: SUBCUTANEOUS | 0 refills | Status: DC

## 2024-03-07 NOTE — Telephone Encounter (Signed)
  MOUNJARO  12.5 MG/0.5ML Pen   [facility]

## 2024-03-09 ENCOUNTER — Telehealth: Payer: Self-pay | Admitting: Pharmacy Technician

## 2024-03-09 ENCOUNTER — Other Ambulatory Visit (HOSPITAL_COMMUNITY): Payer: Self-pay

## 2024-03-09 NOTE — Telephone Encounter (Signed)
 Pharmacy Patient Advocate Encounter   Received notification from CoverMyMeds that prior authorization for Mounjaro  12.5MG /0.5ML auto-injectors is required/requested.   Insurance verification completed.   The patient is insured through [employer].   Per test claim: PA required; PA submitted to above mentioned insurance via Latent Key/confirmation #/EOC [id number] Status is pending

## 2024-03-10 ENCOUNTER — Encounter: Payer: Self-pay | Admitting: Family Medicine

## 2024-03-11 ENCOUNTER — Other Ambulatory Visit (HOSPITAL_COMMUNITY): Payer: Self-pay

## 2024-03-11 NOTE — Telephone Encounter (Signed)
 Pharmacy Patient Advocate Encounter  Received notification from [facility] [employer] that Prior Authorization for Mounjaro  12.5MG /0.5ML auto-injectors has been APPROVED from 03/09/24 to 03/09/25. Ran test claim, Copay is $25.00. This test claim was processed through [facility]- copay amounts may vary at other pharmacies due to pharmacy/plan contracts, or as the patient moves through the different stages of their insurance plan.     PA #/Case ID/Reference #: [id number]

## 2024-03-14 ENCOUNTER — Other Ambulatory Visit: Payer: Self-pay | Admitting: Family Medicine

## 2024-03-14 DIAGNOSIS — E119 Type 2 diabetes mellitus without complications: Secondary | ICD-10-CM

## 2024-03-14 NOTE — Telephone Encounter (Signed)
 PA request has been Approved. New Encounter has been or will be created for follow up. For additional info see Pharmacy Prior Auth telephone encounter from 03/09/2024.

## 2024-03-14 NOTE — Telephone Encounter (Unsigned)
 Copied from CRM #[id number]. Topic: Clinical - Medication Refill >> Mar 14, 2024  3:16 PM Everette C wrote: Medication: tirzepatide  (MOUNJARO ) 12.5 MG/0.5ML Pen [[id number]]  Has the patient contacted their pharmacy? Yes (Agent: If no, request that the patient contact the pharmacy for the refill. If patient does not wish to contact the pharmacy document the reason why and proceed with request.) (Agent: If yes, when and what did the pharmacy advise?)  This is the patient's preferred pharmacy:  [facility] [phone] - [place], [place] - [address] AT [facility] OF [address] [place] [address] Phone: [phone] Fax: [phone]  Is this the correct pharmacy for this prescription? Yes If no, delete pharmacy and type the correct one.   Has the prescription been filled recently? Yes  Is the patient out of the medication? Yes  Has the patient been seen for an appointment in the last year OR does the patient have an upcoming appointment? Yes  Can we respond through MyChart? No  Agent: Please be advised that Rx refills may take up to 3 business days. We ask that you follow-up with your pharmacy.

## 2024-03-18 MED ORDER — MOUNJARO 12.5 MG/0.5ML ~~LOC~~ SOAJ: SUBCUTANEOUS | 0 refills | Status: DC

## 2024-04-07 ENCOUNTER — Other Ambulatory Visit: Payer: Self-pay | Admitting: Nurse Practitioner

## 2024-04-07 DIAGNOSIS — E119 Type 2 diabetes mellitus without complications: Secondary | ICD-10-CM

## 2024-04-15 ENCOUNTER — Telehealth: Payer: Self-pay

## 2024-04-15 ENCOUNTER — Other Ambulatory Visit: Payer: Self-pay

## 2024-04-15 DIAGNOSIS — E1169 Type 2 diabetes mellitus with other specified complication: Secondary | ICD-10-CM

## 2024-04-15 DIAGNOSIS — E119 Type 2 diabetes mellitus without complications: Secondary | ICD-10-CM

## 2024-04-15 DIAGNOSIS — E663 Overweight: Secondary | ICD-10-CM

## 2024-04-15 NOTE — Telephone Encounter (Signed)
 Error

## 2024-04-15 NOTE — Telephone Encounter (Signed)
 Copied from CRM #[id number]. Topic: Clinical - Request for Lab/Test Order >> Apr 15, 2024  9:10 AM Treva T wrote: Reason for CRM: Pt calling, reports she is currently at [facility] to have labs drawn for appt with Dr. Bluford, for diabetes follow up and to review labs.  Pt reports no lab orders are in, and is requesting orders be entered for [facility] to draw while she is there.   While on hold, pt disconnected call.   CB [phone] >> Apr 15, 2024  9:17 AM Treva T wrote: Pt disconnected call while on hold, attempted to contact pt, no response.  Pt is at [facility], needs orders entered for labs.

## 2024-04-15 NOTE — Telephone Encounter (Signed)
Orders have been entered in chart 

## 2024-04-17 ENCOUNTER — Ambulatory Visit: Payer: Self-pay | Admitting: Family Medicine

## 2024-04-17 LAB — COMPREHENSIVE METABOLIC PANEL WITH GFR
ALT: 13 IU/L (ref 0–32)
AST: 18 IU/L (ref 0–40)
Albumin: 4.4 g/dL (ref 3.8–4.9)
Alkaline Phosphatase: 66 IU/L (ref 49–135)
BUN/Creatinine Ratio: 17 (ref 9–23)
BUN: 14 mg/dL (ref 6–24)
Bilirubin Total: 0.4 mg/dL (ref 0.0–1.2)
CO2: 24 mmol/L (ref 20–29)
Calcium: 9.6 mg/dL (ref 8.7–10.2)
Chloride: 102 mmol/L (ref 96–106)
Creatinine, Ser: 0.84 mg/dL (ref 0.57–1.00)
Globulin, Total: 2.4 g/dL (ref 1.5–4.5)
Glucose: 86 mg/dL (ref 70–99)
Potassium: 4.6 mmol/L (ref 3.5–5.2)
Sodium: 139 mmol/L (ref 134–144)
Total Protein: 6.8 g/dL (ref 6.0–8.5)
eGFR: 82 mL/min/1.73 (ref >59)

## 2024-04-17 LAB — CBC WITH DIFFERENTIAL/PLATELET
Basophils Absolute: 0 x10E3/uL (ref 0.0–0.2)
Basos: 1 %
EOS (ABSOLUTE): 0.2 x10E3/uL (ref 0.0–0.4)
Eos: 3 %
Hematocrit: 44.1 % (ref 34.0–46.6)
Hemoglobin: 14.6 g/dL (ref 11.1–15.9)
Immature Grans (Abs): 0 x10E3/uL (ref 0.0–0.1)
Immature Granulocytes: 0 %
Lymphocytes Absolute: 1.5 x10E3/uL (ref 0.7–3.1)
Lymphs: 24 %
MCH: 30.4 pg (ref 26.6–33.0)
MCHC: 33.1 g/dL (ref 31.5–35.7)
MCV: 92 fL (ref 79–97)
Monocytes Absolute: 0.4 x10E3/uL (ref 0.1–0.9)
Monocytes: 7 %
Neutrophils Absolute: 4 x10E3/uL (ref 1.4–7.0)
Neutrophils: 65 %
Platelets: 246 x10E3/uL (ref 150–450)
RBC: 4.8 x10E6/uL (ref 3.77–5.28)
RDW: 12.8 % (ref 11.7–15.4)
WBC: 6.1 x10E3/uL (ref 3.4–10.8)

## 2024-04-17 LAB — MICROALBUMIN / CREATININE URINE RATIO
Creatinine, Urine: 90.2 mg/dL
Microalb/Creat Ratio: <3 mg/g{creat} (ref 0–29)
Microalbumin, Urine: <3 ug/mL

## 2024-04-17 LAB — LIPID PANEL
Chol/HDL Ratio: 4 ratio (ref 0.0–4.4)
Cholesterol, Total: 208 mg/dL — ABNORMAL HIGH (ref 100–199)
HDL: 52 mg/dL (ref >39)
LDL Chol Calc (NIH): 133 mg/dL — ABNORMAL HIGH (ref 0–99)
Triglycerides: 129 mg/dL (ref 0–149)
VLDL Cholesterol Cal: 23 mg/dL (ref 5–40)

## 2024-04-17 LAB — HEMOGLOBIN A1C
Est. average glucose Bld gHb Est-mCnc: 94 mg/dL
Hgb A1c MFr Bld: 4.9 % (ref 4.8–5.6)

## 2024-04-20 ENCOUNTER — Ambulatory Visit: Admitting: Family Medicine

## 2024-04-20 ENCOUNTER — Encounter: Payer: Self-pay | Admitting: Family Medicine

## 2024-04-20 VITALS — BP 119/78 | HR 72 | Temp 97.7°F | Ht 70.0 in | Wt 206.0 lb

## 2024-04-20 DIAGNOSIS — E119 Type 2 diabetes mellitus without complications: Secondary | ICD-10-CM

## 2024-04-20 DIAGNOSIS — Z7985 Long-term (current) use of injectable non-insulin antidiabetic drugs: Secondary | ICD-10-CM

## 2024-04-20 DIAGNOSIS — E663 Overweight: Secondary | ICD-10-CM

## 2024-04-20 DIAGNOSIS — E1169 Type 2 diabetes mellitus with other specified complication: Secondary | ICD-10-CM

## 2024-04-20 DIAGNOSIS — Z7984 Long term (current) use of oral hypoglycemic drugs: Secondary | ICD-10-CM | POA: Diagnosis not present

## 2024-04-20 DIAGNOSIS — E785 Hyperlipidemia, unspecified: Secondary | ICD-10-CM

## 2024-04-20 DIAGNOSIS — K760 Fatty (change of) liver, not elsewhere classified: Secondary | ICD-10-CM | POA: Diagnosis not present

## 2024-04-20 MED ORDER — METFORMIN HCL ER 500 MG PO TB24: ORAL_TABLET | ORAL | 1 refills | Status: AC

## 2024-04-20 MED ORDER — MOUNJARO 12.5 MG/0.5ML ~~LOC~~ SOAJ: SUBCUTANEOUS | 1 refills | Status: AC

## 2024-04-20 MED ORDER — CITALOPRAM HYDROBROMIDE 10 MG PO TABS: 10.0000 mg | ORAL_TABLET | Freq: Every day | ORAL | 0 refills | Status: DC

## 2024-04-20 MED ORDER — ROSUVASTATIN CALCIUM 10 MG PO TABS: 10.0000 mg | ORAL_TABLET | Freq: Every day | ORAL | 1 refills | Status: AC

## 2024-04-20 NOTE — Progress Notes (Signed)
 Subjective:    Patient ID: Joanne Little, female    DOB: Jul 24, 1970, 53 y.o.   MRN: [id number]  HPI Follow up lab results  Results for orders placed or performed in visit on 04/15/24  Lipid panel   Collection Time: 04/15/24  9:39 AM  Result Value Ref Range   Cholesterol, Total 208 (H) 100 - 199 mg/dL   Triglycerides 870 0 - 149 mg/dL   HDL 52 >60 mg/dL   VLDL Cholesterol Cal 23 5 - 40 mg/dL   LDL Chol Calc (NIH) 866 (H) 0 - 99 mg/dL   Chol/HDL Ratio 4.0 0.0 - 4.4 ratio  Urine Microalbumin w/creat. ratio   Collection Time: 04/15/24  9:39 AM  Result Value Ref Range   Creatinine, Urine 90.2 Not Estab. mg/dL   Microalbumin, Urine <6.9 Not Estab. ug/mL   Microalb/Creat Ratio <3 0 - 29 mg/g creat  Hemoglobin A1c   Collection Time: 04/15/24  9:39 AM  Result Value Ref Range   Hgb A1c MFr Bld 4.9 4.8 - 5.6 %   Est. average glucose Bld gHb Est-mCnc 94 mg/dL  Comprehensive Metabolic Panel (CMET)   Collection Time: 04/15/24  9:39 AM  Result Value Ref Range   Glucose 86 70 - 99 mg/dL   BUN 14 6 - 24 mg/dL   Creatinine, Ser 9.15 0.57 - 1.00 mg/dL   eGFR 82 >40 fO/fpw/8.26   BUN/Creatinine Ratio 17 9 - 23   Sodium 139 134 - 144 mmol/L   Potassium 4.6 3.5 - 5.2 mmol/L   Chloride 102 96 - 106 mmol/L   CO2 24 20 - 29 mmol/L   Calcium  9.6 8.7 - 10.2 mg/dL   Total Protein 6.8 6.0 - 8.5 g/dL   Albumin 4.4 3.8 - 4.9 g/dL   Globulin, Total 2.4 1.5 - 4.5 g/dL   Bilirubin Total 0.4 0.0 - 1.2 mg/dL   Alkaline Phosphatase 66 49 - 135 IU/L   AST 18 0 - 40 IU/L   ALT 13 0 - 32 IU/L  CBC with Differential/Platelet   Collection Time: 04/15/24  9:39 AM  Result Value Ref Range   WBC 6.1 3.4 - 10.8 x10E3/uL   RBC 4.80 3.77 - 5.28 x10E6/uL   Hemoglobin 14.6 11.1 - 15.9 g/dL   Hematocrit 55.8 65.9 - 46.6 %   MCV 92 79 - 97 fL   MCH 30.4 26.6 - 33.0 pg   MCHC 33.1 31.5 - 35.7 g/dL   RDW 87.1 88.2 - 84.5 %   Platelets 246 150 - 450 x10E3/uL   Neutrophils 65 Not Estab. %   Lymphs 24 Not Estab.  %   Monocytes 7 Not Estab. %   Eos 3 Not Estab. %   Basos 1 Not Estab. %   Neutrophils Absolute 4.0 1.4 - 7.0 x10E3/uL   Lymphocytes Absolute 1.5 0.7 - 3.1 x10E3/uL   Monocytes Absolute 0.4 0.1 - 0.9 x10E3/uL   EOS (ABSOLUTE) 0.2 0.0 - 0.4 x10E3/uL   Basophils Absolute 0.0 0.0 - 0.2 x10E3/uL   Immature Granulocytes 0 Not Estab. %   Immature Grans (Abs) 0.0 0.0 - 0.1 x10E3/uL    Patient doing a good job with eating healthy Taking her medicine on a regular basis Diabetes under excellent control Hyperlipidemia under good control Patient suffered with significant elevation of A1c despite other medications previously but then was able to get on Mounjaro  and has been doing very well ever since we have had to increase the dose in order to get  the A1c down This is also dramatically helped her liver enzymes It is clinically indicated for her to continue with the current dosing She stated she is changing jobs Review of Systems     Objective:   Physical Exam General-in no acute distress Eyes-no discharge Lungs-respiratory rate normal, CTA CV-no murmurs,RRR Extremities skin warm dry no edema Neuro grossly normal Behavior normal, alert        Assessment & Plan:  1. Diabetes mellitus without complication (HCC) (Primary) Continue current medication A1c under good control  - metFORMIN  (GLUCOPHAGE -XR) 500 MG 24 hr tablet; TAKE 1 TABLET BY MOUTH EVERY DAY WITH BREAKFAST  Dispense: 90 tablet; Refill: 1 - tirzepatide  (MOUNJARO ) 12.5 MG/0.5ML Pen; ADMINISTER 12.5 MG UNDER THE SKIN 1 TIME A WEEK  Dispense: 6 mL; Refill: 1  2. Hyperlipidemia associated with type 2 diabetes mellitus (HCC) Cholesterol under good control up until recently patient states she unfortunately not taking her medicine consistently now she is started back taking it consistently so we will check lab work again in 6 months - rosuvastatin  (CRESTOR ) 10 MG tablet; Take 1 tablet (10 mg total) by mouth daily.  Dispense: 90  tablet; Refill: 1  3. Fatty liver Enzymes look much better healthy diet regular activity  4. Overweight (BMI 25.0-29.9) Patient no longer morbidly obese doing a good job with diet and activity continue above treatment for her diabetes  Labs and follow-up visit 6

## 2024-05-22 ENCOUNTER — Other Ambulatory Visit: Payer: Self-pay | Admitting: Family Medicine

## 2024-10-19 ENCOUNTER — Ambulatory Visit: Admitting: Family Medicine
